# Patient Record
Sex: Female | Born: 1935 | Race: White | Hispanic: No | Marital: Married | Smoking: Never smoker
Health system: Southern US, Community
[De-identification: ages and names within clinical notes are randomized; demographics above are authoritative.]

## PROBLEM LIST (undated history)

## (undated) DIAGNOSIS — G629 Polyneuropathy, unspecified: Secondary | ICD-10-CM

## (undated) DIAGNOSIS — G510 Bell's palsy: Secondary | ICD-10-CM

## (undated) DIAGNOSIS — C439 Malignant melanoma of skin, unspecified: Secondary | ICD-10-CM

## (undated) DIAGNOSIS — N6019 Diffuse cystic mastopathy of unspecified breast: Secondary | ICD-10-CM

## (undated) DIAGNOSIS — M1990 Unspecified osteoarthritis, unspecified site: Secondary | ICD-10-CM

## (undated) DIAGNOSIS — H9190 Unspecified hearing loss, unspecified ear: Secondary | ICD-10-CM

## (undated) DIAGNOSIS — K219 Gastro-esophageal reflux disease without esophagitis: Secondary | ICD-10-CM

## (undated) DIAGNOSIS — Z923 Personal history of irradiation: Secondary | ICD-10-CM

## (undated) DIAGNOSIS — H01119 Allergic dermatitis of unspecified eye, unspecified eyelid: Secondary | ICD-10-CM

## (undated) DIAGNOSIS — L719 Rosacea, unspecified: Secondary | ICD-10-CM

## (undated) DIAGNOSIS — C50919 Malignant neoplasm of unspecified site of unspecified female breast: Secondary | ICD-10-CM

## (undated) DIAGNOSIS — E039 Hypothyroidism, unspecified: Secondary | ICD-10-CM

## (undated) DIAGNOSIS — Z9109 Other allergy status, other than to drugs and biological substances: Secondary | ICD-10-CM

## (undated) DIAGNOSIS — K589 Irritable bowel syndrome without diarrhea: Secondary | ICD-10-CM

## (undated) HISTORY — DX: Rosacea, unspecified: L71.9

## (undated) HISTORY — DX: Bell's palsy: G51.0

## (undated) HISTORY — DX: Malignant melanoma of skin, unspecified: C43.9

## (undated) HISTORY — PX: ABDOMINAL HYSTERECTOMY: SHX81

## (undated) HISTORY — DX: Irritable bowel syndrome, unspecified: K58.9

## (undated) HISTORY — DX: Allergic dermatitis of unspecified eye, unspecified eyelid: H01.119

## (undated) HISTORY — DX: Unspecified osteoarthritis, unspecified site: M19.90

## (undated) HISTORY — DX: Diffuse cystic mastopathy of unspecified breast: N60.19

## (undated) HISTORY — PX: OTHER SURGICAL HISTORY: SHX169

## (undated) HISTORY — PX: EYE SURGERY: SHX253

---

## 2003-11-11 ENCOUNTER — Other Ambulatory Visit: Admission: RE | Admit: 2003-11-11 | Discharge: 2003-11-11 | Payer: Self-pay | Admitting: Obstetrics and Gynecology

## 2004-02-03 ENCOUNTER — Encounter: Admission: RE | Admit: 2004-02-03 | Discharge: 2004-02-03 | Payer: Self-pay | Admitting: Obstetrics and Gynecology

## 2005-01-08 ENCOUNTER — Ambulatory Visit: Payer: Self-pay | Admitting: General Practice

## 2005-01-15 ENCOUNTER — Ambulatory Visit: Payer: Self-pay | Admitting: General Practice

## 2005-03-08 ENCOUNTER — Other Ambulatory Visit: Admission: RE | Admit: 2005-03-08 | Discharge: 2005-03-08 | Payer: Self-pay | Admitting: Obstetrics and Gynecology

## 2006-01-28 ENCOUNTER — Ambulatory Visit: Payer: Self-pay | Admitting: Unknown Physician Specialty

## 2006-01-29 ENCOUNTER — Ambulatory Visit: Payer: Self-pay | Admitting: Unknown Physician Specialty

## 2007-08-20 ENCOUNTER — Ambulatory Visit: Payer: Self-pay | Admitting: Internal Medicine

## 2008-08-23 ENCOUNTER — Ambulatory Visit: Payer: Self-pay | Admitting: Internal Medicine

## 2009-09-05 ENCOUNTER — Ambulatory Visit: Payer: Self-pay | Admitting: Internal Medicine

## 2009-12-10 DIAGNOSIS — C439 Malignant melanoma of skin, unspecified: Secondary | ICD-10-CM

## 2009-12-10 HISTORY — DX: Malignant melanoma of skin, unspecified: C43.9

## 2010-01-11 ENCOUNTER — Ambulatory Visit: Payer: Self-pay | Admitting: Ophthalmology

## 2010-09-06 ENCOUNTER — Ambulatory Visit: Payer: Self-pay | Admitting: Internal Medicine

## 2011-10-24 ENCOUNTER — Ambulatory Visit: Payer: Self-pay | Admitting: Internal Medicine

## 2012-10-24 ENCOUNTER — Ambulatory Visit: Payer: Self-pay | Admitting: Internal Medicine

## 2013-10-26 ENCOUNTER — Ambulatory Visit: Payer: Self-pay | Admitting: Internal Medicine

## 2013-11-10 DIAGNOSIS — G510 Bell's palsy: Secondary | ICD-10-CM | POA: Insufficient documentation

## 2013-11-10 DIAGNOSIS — M1990 Unspecified osteoarthritis, unspecified site: Secondary | ICD-10-CM | POA: Insufficient documentation

## 2013-11-10 DIAGNOSIS — L719 Rosacea, unspecified: Secondary | ICD-10-CM | POA: Insufficient documentation

## 2013-11-10 DIAGNOSIS — Z90710 Acquired absence of both cervix and uterus: Secondary | ICD-10-CM | POA: Insufficient documentation

## 2013-11-10 DIAGNOSIS — C439 Malignant melanoma of skin, unspecified: Secondary | ICD-10-CM | POA: Insufficient documentation

## 2013-12-16 DIAGNOSIS — H01119 Allergic dermatitis of unspecified eye, unspecified eyelid: Secondary | ICD-10-CM | POA: Insufficient documentation

## 2014-07-26 ENCOUNTER — Encounter: Payer: Self-pay | Admitting: Podiatry

## 2014-07-26 ENCOUNTER — Ambulatory Visit (INDEPENDENT_AMBULATORY_CARE_PROVIDER_SITE_OTHER): Payer: Medicare Other | Admitting: Podiatry

## 2014-07-26 VITALS — BP 144/70 | HR 73 | Resp 16 | Ht 62.0 in | Wt 150.0 lb

## 2014-07-26 DIAGNOSIS — L600 Ingrowing nail: Secondary | ICD-10-CM

## 2014-07-26 MED ORDER — NEOMYCIN-POLYMYXIN-HC 3.5-10000-1 OT SOLN: OTIC | Status: DC

## 2014-07-26 NOTE — Patient Instructions (Signed)

## 2014-07-26 NOTE — Progress Notes (Signed)
She presents today complaining of a painful hallux nail right. She states it feels like loading I have fluid out from underneath.  Objective: Vital signs are stable she is alert and oriented x3. Pulses are strongly palpable right. Nail plate hallux right is thick yellow dystrophic sharply incurvated and painful on palpation. Mild erythema surrounding the nail plate.  Assessment: Pain in limb secondary to onychomycosis and ingrown nail hallux right. Paronychia hallux right.  Plan: Total nail avulsion permanent in nature. This was performed after 3 cc of a 50-50 mixture of Marcaine plain lidocaine plain was infiltrated in a hallux block right. The nail plate was avulsed and 3 applications of phenol were applied dry sterile compressive dressing was applied. She "both oral and home-going instructions for the care of soaking this toe as well as a prescription for Cortisporin Otic which applied twice daily after soaking. I will followup with her in one week.

## 2014-08-02 ENCOUNTER — Encounter: Payer: Self-pay | Admitting: Podiatry

## 2014-08-02 ENCOUNTER — Ambulatory Visit (INDEPENDENT_AMBULATORY_CARE_PROVIDER_SITE_OTHER): Payer: Medicare Other | Admitting: Podiatry

## 2014-08-02 VITALS — BP 142/86 | HR 74 | Resp 12

## 2014-08-02 DIAGNOSIS — L600 Ingrowing nail: Secondary | ICD-10-CM

## 2014-08-02 NOTE — Progress Notes (Signed)
She presents today for followup of her nail avulsion hallux right.  Objective: Vital signs are stable she is alert and oriented x3. The hallux nail bed appears to be granulating in quite nicely epithelialization is occurring.  Assessment: Well-healing surgical toe hallux right.  Plan: Discontinue Betadine start with Epsom salts warm water soaks covered in the day and Levaquin night.

## 2014-10-27 ENCOUNTER — Ambulatory Visit: Payer: Self-pay | Admitting: Internal Medicine

## 2015-03-05 ENCOUNTER — Ambulatory Visit: Payer: Self-pay | Admitting: Internal Medicine

## 2015-10-18 ENCOUNTER — Other Ambulatory Visit: Payer: Self-pay | Admitting: Internal Medicine

## 2015-10-18 DIAGNOSIS — Z1231 Encounter for screening mammogram for malignant neoplasm of breast: Secondary | ICD-10-CM

## 2015-10-31 ENCOUNTER — Ambulatory Visit
Admission: RE | Admit: 2015-10-31 | Discharge: 2015-10-31 | Disposition: A | Discharge status: Home / Self Care | Payer: Medicare Other | Source: Ambulatory Visit | Attending: Internal Medicine | Admitting: Internal Medicine

## 2015-10-31 ENCOUNTER — Other Ambulatory Visit: Payer: Self-pay | Admitting: Internal Medicine

## 2015-10-31 DIAGNOSIS — Z1231 Encounter for screening mammogram for malignant neoplasm of breast: Secondary | ICD-10-CM

## 2015-12-11 DIAGNOSIS — Z923 Personal history of irradiation: Secondary | ICD-10-CM

## 2015-12-11 DIAGNOSIS — C50919 Malignant neoplasm of unspecified site of unspecified female breast: Secondary | ICD-10-CM

## 2015-12-11 HISTORY — DX: Personal history of irradiation: Z92.3

## 2015-12-11 HISTORY — DX: Malignant neoplasm of unspecified site of unspecified female breast: C50.919

## 2016-01-11 ENCOUNTER — Ambulatory Visit (INDEPENDENT_AMBULATORY_CARE_PROVIDER_SITE_OTHER): Payer: Medicare Other

## 2016-01-11 ENCOUNTER — Encounter: Payer: Self-pay | Admitting: Podiatry

## 2016-01-11 ENCOUNTER — Ambulatory Visit (INDEPENDENT_AMBULATORY_CARE_PROVIDER_SITE_OTHER): Payer: Medicare Other | Admitting: Podiatry

## 2016-01-11 VITALS — BP 149/68 | HR 70 | Resp 16

## 2016-01-11 DIAGNOSIS — N760 Acute vaginitis: Secondary | ICD-10-CM | POA: Insufficient documentation

## 2016-01-11 DIAGNOSIS — M722 Plantar fascial fibromatosis: Secondary | ICD-10-CM | POA: Diagnosis not present

## 2016-01-11 DIAGNOSIS — N390 Urinary tract infection, site not specified: Secondary | ICD-10-CM | POA: Insufficient documentation

## 2016-01-11 DIAGNOSIS — K589 Irritable bowel syndrome without diarrhea: Secondary | ICD-10-CM | POA: Insufficient documentation

## 2016-01-11 DIAGNOSIS — Z9109 Other allergy status, other than to drugs and biological substances: Secondary | ICD-10-CM | POA: Insufficient documentation

## 2016-01-11 DIAGNOSIS — N6019 Diffuse cystic mastopathy of unspecified breast: Secondary | ICD-10-CM | POA: Insufficient documentation

## 2016-01-11 NOTE — Patient Instructions (Signed)

## 2016-01-11 NOTE — Progress Notes (Signed)
She presents today with chief complaint of pain to the right heel. States this been going on for a while now.  Objective: Vital signs stable alert and 3 pulses are palpable. Radiographs and straight soft tissue increase in density of the fascial calcaneal insertion site. Pulses are palpable. On palpation medial tubercle of the right heel.  Assessment: Plantar fascitis right.  Plan: Injected the right heel today with Kenalog and local anesthetic placed her in plantar fascial brace.

## 2016-02-08 DIAGNOSIS — C50919 Malignant neoplasm of unspecified site of unspecified female breast: Secondary | ICD-10-CM | POA: Insufficient documentation

## 2016-02-10 ENCOUNTER — Other Ambulatory Visit: Payer: Self-pay | Admitting: Internal Medicine

## 2016-02-10 DIAGNOSIS — N63 Unspecified lump in unspecified breast: Secondary | ICD-10-CM

## 2016-02-21 ENCOUNTER — Ambulatory Visit
Admission: RE | Admit: 2016-02-21 | Discharge: 2016-02-21 | Disposition: A | Discharge status: Home / Self Care | Payer: Medicare Other | Source: Ambulatory Visit | Attending: Internal Medicine | Admitting: Internal Medicine

## 2016-02-21 ENCOUNTER — Other Ambulatory Visit: Payer: Self-pay | Admitting: Internal Medicine

## 2016-02-21 DIAGNOSIS — N63 Unspecified lump in unspecified breast: Secondary | ICD-10-CM

## 2016-02-21 DIAGNOSIS — N6310 Unspecified lump in the right breast, unspecified quadrant: Secondary | ICD-10-CM

## 2016-02-27 ENCOUNTER — Ambulatory Visit
Admission: RE | Admit: 2016-02-27 | Discharge: 2016-02-27 | Disposition: A | Discharge status: Home / Self Care | Payer: Medicare Other | Source: Ambulatory Visit | Attending: Internal Medicine | Admitting: Internal Medicine

## 2016-02-27 DIAGNOSIS — C50911 Malignant neoplasm of unspecified site of right female breast: Secondary | ICD-10-CM | POA: Diagnosis not present

## 2016-02-27 DIAGNOSIS — N6310 Unspecified lump in the right breast, unspecified quadrant: Secondary | ICD-10-CM

## 2016-02-27 DIAGNOSIS — N63 Unspecified lump in breast: Secondary | ICD-10-CM | POA: Diagnosis present

## 2016-02-29 HISTORY — PX: BREAST BIOPSY: SHX20

## 2016-03-01 ENCOUNTER — Inpatient Hospital Stay: Payer: Medicare Other | Attending: Oncology | Admitting: Oncology

## 2016-03-01 ENCOUNTER — Encounter: Payer: Self-pay | Admitting: Oncology

## 2016-03-01 ENCOUNTER — Encounter: Payer: Self-pay | Admitting: *Deleted

## 2016-03-01 VITALS — BP 146/69 | HR 76 | Temp 98.4°F | Resp 18 | Ht 63.39 in | Wt 156.1 lb

## 2016-03-01 DIAGNOSIS — K219 Gastro-esophageal reflux disease without esophagitis: Secondary | ICD-10-CM | POA: Insufficient documentation

## 2016-03-01 DIAGNOSIS — Z170 Estrogen receptor positive status [ER+]: Secondary | ICD-10-CM | POA: Insufficient documentation

## 2016-03-01 DIAGNOSIS — F419 Anxiety disorder, unspecified: Secondary | ICD-10-CM | POA: Diagnosis not present

## 2016-03-01 DIAGNOSIS — Z85820 Personal history of malignant melanoma of skin: Secondary | ICD-10-CM | POA: Insufficient documentation

## 2016-03-01 DIAGNOSIS — K589 Irritable bowel syndrome without diarrhea: Secondary | ICD-10-CM | POA: Insufficient documentation

## 2016-03-01 DIAGNOSIS — N6019 Diffuse cystic mastopathy of unspecified breast: Secondary | ICD-10-CM | POA: Diagnosis not present

## 2016-03-01 DIAGNOSIS — C50911 Malignant neoplasm of unspecified site of right female breast: Secondary | ICD-10-CM

## 2016-03-01 DIAGNOSIS — Z7982 Long term (current) use of aspirin: Secondary | ICD-10-CM | POA: Diagnosis not present

## 2016-03-01 DIAGNOSIS — C50511 Malignant neoplasm of lower-outer quadrant of right female breast: Secondary | ICD-10-CM | POA: Diagnosis not present

## 2016-03-01 DIAGNOSIS — Z79899 Other long term (current) drug therapy: Secondary | ICD-10-CM | POA: Insufficient documentation

## 2016-03-01 LAB — SURGICAL PATHOLOGY

## 2016-03-01 NOTE — Progress Notes (Signed)
  Oncology Nurse Navigator Documentation  Navigator Location: [facility]-Med Onc (03/01/16 1100) Navigator Encounter Type: Initial MedOnc (03/01/16 1100)   Abnormal Finding Date: 02/21/16 (03/01/16 1100) Confirmed Diagnosis Date: 02/28/16 (03/01/16 1100)     Patient Visit Type: MedOnc (03/01/16 1100)   Barriers/Navigation Needs: Education;Coordination of Care (03/01/16 1100) Education: Understanding Cancer/ Treatment Options;Coping with Diagnosis/ Prognosis;Newly Diagnosed Cancer Education;Preparing for Upcoming Surgery/ Treatment (03/01/16 1100) Interventions: Coordination of Care (03/01/16 1100)            Acuity: Level 2 (03/01/16 1100)         Time Spent with Patient: 120 (03/01/16 1100)   Met with patient and her husband today during her initial medical oncology consult with Dr. Grayland Ormond.  ER/PR and Her2neu not available today.  Gave patient breast cancer educational literature, "My Breast Cancer Treatment Handbook" by Josephine Igo, RN.  Scheduled patient to see Dr. Rochel Brome tomorrow at 8:00 for surgical consultation.  Patient is to call me back with her surgery date, and I can schedule a follow up with Dr. Grayland Ormond at that time.  Reviewed plan of care.  Patient is to call if she has any questions or needs.

## 2016-03-01 NOTE — Progress Notes (Signed)
Patient noticed a discoloration of right nipple for 3 months before having a work up from PCP.

## 2016-03-02 ENCOUNTER — Other Ambulatory Visit: Payer: Self-pay | Admitting: Surgery

## 2016-03-02 DIAGNOSIS — C50011 Malignant neoplasm of nipple and areola, right female breast: Secondary | ICD-10-CM

## 2016-03-08 ENCOUNTER — Encounter
Admission: RE | Admit: 2016-03-08 | Discharge: 2016-03-08 | Disposition: A | Discharge status: Home / Self Care | Payer: Medicare Other | Source: Ambulatory Visit | Attending: Surgery | Admitting: Surgery

## 2016-03-08 DIAGNOSIS — Z01812 Encounter for preprocedural laboratory examination: Secondary | ICD-10-CM | POA: Diagnosis present

## 2016-03-08 DIAGNOSIS — Z01810 Encounter for preprocedural cardiovascular examination: Secondary | ICD-10-CM | POA: Insufficient documentation

## 2016-03-08 HISTORY — DX: Other allergy status, other than to drugs and biological substances: Z91.09

## 2016-03-08 HISTORY — DX: Gastro-esophageal reflux disease without esophagitis: K21.9

## 2016-03-08 LAB — COMPREHENSIVE METABOLIC PANEL
ALBUMIN: 3.8 g/dL (ref 3.5–5.0)
ALK PHOS: 74 U/L (ref 38–126)
ALT: 41 U/L (ref 14–54)
AST: 59 U/L — ABNORMAL HIGH (ref 15–41)
Anion gap: 8 (ref 5–15)
BUN: 21 mg/dL — ABNORMAL HIGH (ref 6–20)
CALCIUM: 9.3 mg/dL (ref 8.9–10.3)
CO2: 26 mmol/L (ref 22–32)
CREATININE: 0.94 mg/dL (ref 0.44–1.00)
Chloride: 105 mmol/L (ref 101–111)
GFR calc Af Amer: >60 mL/min (ref >60)
GFR calc non Af Amer: 55 mL/min — ABNORMAL LOW (ref >60)
GLUCOSE: 104 mg/dL — AB (ref 65–99)
Potassium: 3.9 mmol/L (ref 3.5–5.1)
SODIUM: 139 mmol/L (ref 135–145)
Total Bilirubin: 0.5 mg/dL (ref 0.3–1.2)
Total Protein: 7 g/dL (ref 6.5–8.1)

## 2016-03-08 LAB — CBC WITH DIFFERENTIAL/PLATELET
BASOS ABS: 0 10*3/uL (ref 0–0.1)
BASOS PCT: 1 %
EOS ABS: 0.2 10*3/uL (ref 0–0.7)
EOS PCT: 2 %
HCT: 38.9 % (ref 35.0–47.0)
Hemoglobin: 13.5 g/dL (ref 12.0–16.0)
LYMPHS PCT: 21 %
Lymphs Abs: 1.6 10*3/uL (ref 1.0–3.6)
MCH: 32.1 pg (ref 26.0–34.0)
MCHC: 34.6 g/dL (ref 32.0–36.0)
MCV: 92.7 fL (ref 80.0–100.0)
Monocytes Absolute: 0.5 10*3/uL (ref 0.2–0.9)
Monocytes Relative: 6 %
Neutro Abs: 5.3 10*3/uL (ref 1.4–6.5)
Neutrophils Relative %: 70 %
Platelets: 232 10*3/uL (ref 150–440)
RBC: 4.19 MIL/uL (ref 3.80–5.20)
RDW: 13.4 % (ref 11.5–14.5)
WBC: 7.5 10*3/uL (ref 3.6–11.0)

## 2016-03-08 NOTE — Patient Instructions (Signed)
  Your procedure is scheduled on: 03/16/16 Fri Report to Day Surgery.2nd floor medical mall To find out your arrival time please call [phone] between 1PM - 3PM on 03/15/16 Thurs.  Remember: Instructions that are not followed completely may result in serious medical risk, up to and including death, or upon the discretion of your surgeon and anesthesiologist your surgery may need to be rescheduled.    _x___ 1. Do not eat food or drink liquids after midnight. No gum chewing or hard candies.     ____ 2. No Alcohol for 24 hours before or after surgery.   ____ 3. Bring all medications with you on the day of surgery if instructed.    __x__ 4. Notify your doctor if there is any change in your medical condition     (cold, fever, infections).     Do not wear jewelry, make-up, hairpins, clips or nail polish.  Do not wear lotions, powders, or perfumes. You may wear deodorant.  Do not shave 48 hours prior to surgery. Men may shave face and neck.  Do not bring valuables to the hospital.    [facility] is not responsible for any belongings or valuables.               Contacts, dentures or bridgework may not be worn into surgery.  Leave your suitcase in the car. After surgery it may be brought to your room.  For patients admitted to the hospital, discharge time is determined by your                treatment team.   Patients discharged the day of surgery will not be allowed to drive home.   Please read over the following fact sheets that you were given:      __x__ Take these medicines the morning of surgery with A SIP OF WATER:    1. RABEprazole (ACIPHEX) 20 MG tablet  2.   3.   4.  5.  6.  ____ Fleet Enema (as directed)   _x___ Use CHG Soap as directed  ____ Use inhalers on the day of surgery  ____ Stop metformin 2 days prior to surgery    ____ Take 1/2 of usual insulin dose the night before surgery and none on the morning of surgery.   __x__ Stop Coumadin/Plavix/aspirin on stop  aspirin 1 week before surgery  _x___ Stop Anti-inflammatories on stop naproxen 1 week before surgery, may take Tylenol as needed   ____ Stop supplements until after surgery.    ____ Bring C-Pap to the hospital.

## 2016-03-10 HISTORY — PX: BREAST EXCISIONAL BIOPSY: SUR124

## 2016-03-14 DIAGNOSIS — C50111 Malignant neoplasm of central portion of right female breast: Secondary | ICD-10-CM | POA: Insufficient documentation

## 2016-03-14 NOTE — Progress Notes (Signed)
[facility]  [phone] Fax:(336) [phone]  ID: [id number] FRARY OB: May 05, 1933  MR#: [id number]  ZDG#:[id number]  Patient Care Team: Idelle Crouch, MD as PCP - General (Internal Medicine)  CHIEF COMPLAINT:  Chief Complaint  Patient presents with  . New Evaluation    breast cancer    INTERVAL HISTORY: Patient is an 82 year old female who had a normal screening mammogram in November 2016. She noted some discoloration of her breast which prompted another mammogram revealing a suspicious mass. Subsequent biopsy confirmed adenocarcinoma of her right breast. Currently, she is anxious but otherwise feels well. She has no neurologic complaint. She denies any fevers. She has a good appetite and denies weight loss. She denies any pain. She has no chest pain or shortness of breath. She denies any nausea, vomiting, constipation, or diarrhea. She has no urinary complaints. Patient feels at her baseline and offers no further specific complaints.  REVIEW OF SYSTEMS:   Review of Systems  Constitutional: Negative.  Negative for fever, weight loss and malaise/fatigue.  Respiratory: Negative.  Negative for sputum production.   Cardiovascular: Negative.  Negative for chest pain.  Gastrointestinal: Negative.   Genitourinary: Negative.   Neurological: Negative.  Negative for weakness.  Psychiatric/Behavioral: The patient is nervous/anxious.     As per HPI. Otherwise, a complete review of systems is negatve.  PAST MEDICAL HISTORY: Past Medical History  Diagnosis Date  . Dermatitis contact, eyelid   . Rosacea   . Bell's palsy   . Melanoma of skin ([place]) 2011    left upper arm  . Osteoarthritis   . Fibrocystic breast disease     being followed at [facility]   . IBS (irritable bowel syndrome)   . Environmental allergies   . GERD (gastroesophageal reflux disease)     PAST SURGICAL HISTORY: Past Surgical History  Procedure Laterality Date  . Eyelid lift    . Repair  tear ducts    . Endoscopic carpal tunnel release Right   . Abdominal hysterectomy      FAMILY HISTORY Family History  Problem Relation Age of Onset  . Breast cancer Neg Hx   . Skin cancer Brother     non-melanoma  . Skin cancer Father     non-melanoma  . Throat cancer Father   . Stroke Mother        ADVANCED DIRECTIVES:    HEALTH MAINTENANCE: Social History  Substance Use Topics  . Smoking status: Never Smoker   . Smokeless tobacco: Not on file  . Alcohol Use: 0.0 oz/week    0 Standard drinks or equivalent per week     Colonoscopy:  PAP:  Bone density:  Lipid panel:  Allergies  Allergen Reactions  . Benzalkonium Chloride Other (See Comments)    Positive patch test  . Garlic Diarrhea  . Methylisothiazolinone Other (See Comments)    Positive patch test Positive patch test    Current Outpatient Prescriptions  Medication Sig Dispense Refill  . aspirin EC 81 MG tablet Take by mouth.    . Cholecalciferol (VITAMIN D3) 1000 units CAPS Take by mouth daily.    . fexofenadine (ALLEGRA) 180 MG tablet Take by mouth.    . fluticasone (FLONASE) 50 MCG/ACT nasal spray Place 1 spray into the nose daily.     Marland Kitchen gentamicin ointment (GARAMYCIN) 0.1 %     . metroNIDAZOLE (METROGEL) 0.75 % gel Apply 1 application topically 2 (two) times daily.     . naproxen sodium (ANAPROX) 220 MG  tablet Take 220 mg by mouth 2 (two) times daily with a meal.    . RABEprazole (ACIPHEX) 20 MG tablet Take 20 mg by mouth 2 (two) times daily.     Marland Kitchen acetaminophen (TYLENOL) 500 MG tablet Take 500 mg by mouth every 6 (six) hours as needed.    . Biotin 5000 MCG TABS Take 1 tablet by mouth daily.    Marland Kitchen Optical Supplies (OCUSOFT DRY EYE) KIT by Does not apply route.    Marland Kitchen Propylene Glycol (SYSTANE BALANCE OP) Apply 1 drop to eye 2 (two) times daily.     No current facility-administered medications for this visit.    OBJECTIVE: Filed Vitals:   03/01/16 1037  BP: 146/69  Pulse: 76  Temp: 98.4 F (36.9  C)  Resp: 18     Body mass index is 27.31 kg/(m^2).    ECOG FS:0 - Asymptomatic  General: Well-developed, well-nourished, no acute distress. Eyes: Pink conjunctiva, anicteric sclera. HEENT: Normocephalic, moist mucous membranes, clear oropharnyx. Breasts: Patient requested exam be deferred today. Lungs: Clear to auscultation bilaterally. Heart: Regular rate and rhythm. No rubs, murmurs, or gallops. Abdomen: Soft, nontender, nondistended. No organomegaly noted, normoactive bowel sounds. Musculoskeletal: No edema, cyanosis, or clubbing. Neuro: Alert, answering all questions appropriately. Cranial nerves grossly intact. Skin: No rashes or petechiae noted. Psych: Normal affect. Lymphatics: No cervical, calvicular, axillary or inguinal LAD.   LAB RESULTS:  Lab Results  Component Value Date   NA 139 03/08/2016   K 3.9 03/08/2016   CL 105 03/08/2016   CO2 26 03/08/2016   GLUCOSE 104* 03/08/2016   BUN 21* 03/08/2016   CREATININE 0.94 03/08/2016   CALCIUM 9.3 03/08/2016   PROT 7.0 03/08/2016   ALBUMIN 3.8 03/08/2016   AST 59* 03/08/2016   ALT 41 03/08/2016   ALKPHOS 74 03/08/2016   BILITOT 0.5 03/08/2016   GFRNONAA 55* 03/08/2016   GFRAA >60 03/08/2016    Lab Results  Component Value Date   WBC 7.5 03/08/2016   NEUTROABS 5.3 03/08/2016   HGB 13.5 03/08/2016   HCT 38.9 03/08/2016   MCV 92.7 03/08/2016   PLT 232 03/08/2016     STUDIES: Mm Digital Diagnostic Unilat R  02/27/2016  CLINICAL DATA:  Status post ultrasound-guided right breast biopsy EXAM: DIAGNOSTIC RIGHT MAMMOGRAM POST ULTRASOUND BIOPSY COMPARISON:  Previous exam(s). FINDINGS: Mammographic images were obtained following ultrasound guided biopsy of an indeterminate right breast mass at 7 o'clock, 1 cm from the nipple. Post biopsy mammogram demonstrates the ribbon shaped biopsy marker in the expected location within the slightly lower, outer right breast. IMPRESSION: Satisfactory marker placement as above. Final  Assessment: Post Procedure Mammograms for Marker Placement Electronically Signed   By: Pamelia Hoit M.D.   On: 02/27/2016 14:49   US Breast Ltd Uni Right Inc Axilla  02/21/2016  CLINICAL DATA:  82 year old patient palpates a right breast retroareolar lump. She has also noticed that a portion of the lower outer quadrant of the right nipple has a dark colored protrusion/lump. She denies any nipple discharge. EXAM: DIGITAL DIAGNOSTIC RIGHT MAMMOGRAM WITH 3D TOMOSYNTHESIS WITH CAD ULTRASOUND RIGHT BREAST COMPARISON:  Previous exam(s). ACR Breast Density Category b: There are scattered areas of fibroglandular density. FINDINGS: A metallic skin marker was placed at the site of the palpable lump, just inferior to the right nipple. Spot tangential view of this region shows a superficial circumscribed oval 11 mm mass. The anterior margin of the mass appears contiguous with versus immediately subjacent to the right nipple.  No additional masses are identified in the right breast. There is no suspicious microcalcification or distortion. Mammographic images were processed with CAD. On physical exam, there is an approximately 4 mm raised, dark purplish area associated with the 7 o'clock region of the right nipple. The overlying skin is intact. I attempted to elicit nipple discharge and there is none. Deep to the nipple is a palpable retroareolar mass measuring approximately 1 cm. Targeted ultrasound is performed, showing a hypoechoic solid oval mass with a few internal cystic spaces is imaged immediately deep to the right nipple measuring 1.1 x 0.9 x 0.9 cm. There is pulsatile vascular flow within portions of the mass. The superficial margin of the mass is contiguous with, and may involve the skin of the right nipple. Ultrasound of the right axilla is negative. IMPRESSION: 1.1 cm solid vascular mass retroareolar right breast, contiguous with versus immediately subjacent to the skin of the right nipple. The patient has a purple  discoloration of the tip of the nipple in the 7 o'clock region on physical exam. Findings are most suggestive of papilloma. Malignancy cannot be excluded. It is possible that the discoloration of the dermis of the right nipple could be secondary to blood products if the vascular mass has bled. RECOMMENDATION: Ultrasound-guided core needle biopsy is recommended. Our office will contact the ordering physician's office to obtain an order, and will arrange a date and time for biopsy with the patient. I have discussed the findings and recommendations with the patient. Results were also provided in writing at the conclusion of the visit. If applicable, a reminder letter will be sent to the patient regarding the next appointment. BI-RADS CATEGORY  4: Suspicious. Electronically Signed   By: Curlene Dolphin M.D.   On: 02/21/2016 09:11   Mm Diag Breast Tomo Uni Right  02/21/2016  CLINICAL DATA:  82 year old patient palpates a right breast retroareolar lump. She has also noticed that a portion of the lower outer quadrant of the right nipple has a dark colored protrusion/lump. She denies any nipple discharge. EXAM: DIGITAL DIAGNOSTIC RIGHT MAMMOGRAM WITH 3D TOMOSYNTHESIS WITH CAD ULTRASOUND RIGHT BREAST COMPARISON:  Previous exam(s). ACR Breast Density Category b: There are scattered areas of fibroglandular density. FINDINGS: A metallic skin marker was placed at the site of the palpable lump, just inferior to the right nipple. Spot tangential view of this region shows a superficial circumscribed oval 11 mm mass. The anterior margin of the mass appears contiguous with versus immediately subjacent to the right nipple. No additional masses are identified in the right breast. There is no suspicious microcalcification or distortion. Mammographic images were processed with CAD. On physical exam, there is an approximately 4 mm raised, dark purplish area associated with the 7 o'clock region of the right nipple. The overlying skin is  intact. I attempted to elicit nipple discharge and there is none. Deep to the nipple is a palpable retroareolar mass measuring approximately 1 cm. Targeted ultrasound is performed, showing a hypoechoic solid oval mass with a few internal cystic spaces is imaged immediately deep to the right nipple measuring 1.1 x 0.9 x 0.9 cm. There is pulsatile vascular flow within portions of the mass. The superficial margin of the mass is contiguous with, and may involve the skin of the right nipple. Ultrasound of the right axilla is negative. IMPRESSION: 1.1 cm solid vascular mass retroareolar right breast, contiguous with versus immediately subjacent to the skin of the right nipple. The patient has a purple discoloration of the tip  of the nipple in the 7 o'clock region on physical exam. Findings are most suggestive of papilloma. Malignancy cannot be excluded. It is possible that the discoloration of the dermis of the right nipple could be secondary to blood products if the vascular mass has bled. RECOMMENDATION: Ultrasound-guided core needle biopsy is recommended. Our office will contact the ordering physician's office to obtain an order, and will arrange a date and time for biopsy with the patient. I have discussed the findings and recommendations with the patient. Results were also provided in writing at the conclusion of the visit. If applicable, a reminder letter will be sent to the patient regarding the next appointment. BI-RADS CATEGORY  4: Suspicious. Electronically Signed   By: Curlene Dolphin M.D.   On: 02/21/2016 09:11   [place] Rt Breast Bx W Loc Dev 1st Lesion Img Bx Spec US Guide  02/29/2016  ADDENDUM REPORT: 02/29/2016 16:30 ADDENDUM: Pathology of the right breast biopsy revealed a MUCINOUS MAMMARY ADENOCARCINOMA. PRELIMINARY GRADE: 2 ([place] GRADE) Comment: The definitive grade will be assigned on the excisional specimen. These findings were communicated to Dr. Doy Hutching on 02/28/2016 by the pathologist. This was  found to be concordant with Dr. Raul Del impression and notes. Recommendations:  Surgical and oncology referral. The patient was contacted by Jetta Lout, [place] on 02/29/16. She stated she has done well following the biopsy with no bleeding, bruising, or hematoma. Post biopsy instructions were reviewed with the patient. All of her questions were answered. She was encouraged to call the [place] [facility] with any further questions or concerns. She stated she had been contacted by her physician with the results. She has an appointment with Dr. Grayland Ormond in oncology on 03/01/16 at 9:45 AM. Addendum by Jetta Lout, [place] on 02/29/16. Electronically Signed   By: Pamelia Hoit M.D.   On: 02/29/2016 16:30  02/29/2016  CLINICAL DATA:  82 year old female for biopsy of an indeterminate right breast mass EXAM: ULTRASOUND GUIDED RIGHT BREAST CORE NEEDLE BIOPSY COMPARISON:  Previous exam(s). PROCEDURE: I met with the patient and we discussed the procedure of ultrasound-guided biopsy, including benefits and alternatives. We discussed the high likelihood of a successful procedure. We discussed the risks of the procedure including infection, bleeding, tissue injury, clip migration, and inadequate sampling. Informed written consent was given. The usual time-out protocol was performed immediately prior to the procedure. Using sterile technique and 1% Lidocaine as local anesthetic, under direct ultrasound visualization, a 12 gauge vacuum-assisted device was used to perform biopsy of an indeterminate right breast mass at 7 o'clock, 1 cm from the nipple using a lateral to medial approach. At the conclusion of the procedure, a ribbon shaped tissue marker clip was deployed into the biopsy cavity. Follow-up 2-view mammogram was performed and dictated separately. IMPRESSION: Ultrasound-guided biopsy of an indeterminate right breast mass at 7 o'clock. No apparent complications. Electronically Signed: By: Pamelia Hoit M.D. 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  PLAN:    1. Breast cancer: Although patient is 82 years old, she may benefit from adjuvant chemotherapy given the speed at which this tumor arose. She had a normal mammogram in November 2016. While awaiting final pathology results, will order mammoprint on her biopsy. Referral has been made to surgery for consideration of lumpectomy and/or mastectomy.  Patient may or may not benefit from XRT depending on whether she has a lumpectomy or mastectomy. She will benefit from  an aromatase inhibitor for 5 years given the ER/PR status of her tumor. Patient will follow-up in approximately 2-3 weeks after her surgery to discuss her final pathology results and further treatment planning.  Approximately 45 minutes was spent in discussion of which greater than 50% was consultation.  Patient expressed understanding and was in agreement with this plan. She also understands that She can call clinic at any time with any questions, concerns, or complaints.    Lloyd Huger, MD   03/14/2016 8:42 AM

## 2016-03-16 ENCOUNTER — Ambulatory Visit
Admission: RE | Admit: 2016-03-16 | Discharge: 2016-03-16 | Disposition: A | Discharge status: Home / Self Care | Payer: Medicare Other | Source: Ambulatory Visit | Attending: Surgery | Admitting: Surgery

## 2016-03-16 ENCOUNTER — Ambulatory Visit: Payer: Medicare Other | Admitting: Certified Registered"

## 2016-03-16 ENCOUNTER — Encounter: Payer: Self-pay | Admitting: *Deleted

## 2016-03-16 ENCOUNTER — Encounter
Admission: RE | Disposition: A | Discharge status: Home / Self Care | Payer: Self-pay | Source: Ambulatory Visit | Attending: Surgery

## 2016-03-16 ENCOUNTER — Encounter: Payer: Self-pay | Admitting: Oncology

## 2016-03-16 DIAGNOSIS — K589 Irritable bowel syndrome without diarrhea: Secondary | ICD-10-CM | POA: Diagnosis not present

## 2016-03-16 DIAGNOSIS — Z800 Family history of malignant neoplasm of digestive organs: Secondary | ICD-10-CM | POA: Insufficient documentation

## 2016-03-16 DIAGNOSIS — C50511 Malignant neoplasm of lower-outer quadrant of right female breast: Secondary | ICD-10-CM | POA: Insufficient documentation

## 2016-03-16 DIAGNOSIS — Z9889 Other specified postprocedural states: Secondary | ICD-10-CM | POA: Diagnosis not present

## 2016-03-16 DIAGNOSIS — Z7951 Long term (current) use of inhaled steroids: Secondary | ICD-10-CM | POA: Diagnosis not present

## 2016-03-16 DIAGNOSIS — Z79899 Other long term (current) drug therapy: Secondary | ICD-10-CM | POA: Insufficient documentation

## 2016-03-16 DIAGNOSIS — Z8249 Family history of ischemic heart disease and other diseases of the circulatory system: Secondary | ICD-10-CM | POA: Insufficient documentation

## 2016-03-16 DIAGNOSIS — Z90710 Acquired absence of both cervix and uterus: Secondary | ICD-10-CM | POA: Insufficient documentation

## 2016-03-16 DIAGNOSIS — Z808 Family history of malignant neoplasm of other organs or systems: Secondary | ICD-10-CM | POA: Diagnosis not present

## 2016-03-16 DIAGNOSIS — M1990 Unspecified osteoarthritis, unspecified site: Secondary | ICD-10-CM | POA: Diagnosis not present

## 2016-03-16 DIAGNOSIS — Z91018 Allergy to other foods: Secondary | ICD-10-CM | POA: Diagnosis not present

## 2016-03-16 DIAGNOSIS — Z823 Family history of stroke: Secondary | ICD-10-CM | POA: Insufficient documentation

## 2016-03-16 DIAGNOSIS — Z87440 Personal history of urinary (tract) infections: Secondary | ICD-10-CM | POA: Diagnosis not present

## 2016-03-16 DIAGNOSIS — Z91048 Other nonmedicinal substance allergy status: Secondary | ICD-10-CM | POA: Diagnosis not present

## 2016-03-16 DIAGNOSIS — Z7982 Long term (current) use of aspirin: Secondary | ICD-10-CM | POA: Diagnosis not present

## 2016-03-16 DIAGNOSIS — Z85820 Personal history of malignant melanoma of skin: Secondary | ICD-10-CM | POA: Insufficient documentation

## 2016-03-16 DIAGNOSIS — C50011 Malignant neoplasm of nipple and areola, right female breast: Secondary | ICD-10-CM

## 2016-03-16 DIAGNOSIS — C50911 Malignant neoplasm of unspecified site of right female breast: Secondary | ICD-10-CM | POA: Diagnosis present

## 2016-03-16 HISTORY — PX: BREAST LUMPECTOMY: SHX2

## 2016-03-16 HISTORY — PX: PARTIAL MASTECTOMY WITH NEEDLE LOCALIZATION: SHX6008

## 2016-03-16 HISTORY — PX: SENTINEL NODE BIOPSY: SHX6608

## 2016-03-16 SURGERY — PARTIAL MASTECTOMY WITH NEEDLE LOCALIZATION
Anesthesia: General | Site: Breast | Laterality: Right | Wound class: Clean

## 2016-03-16 MED ORDER — ACETAMINOPHEN 10 MG/ML IV SOLN
INTRAVENOUS | Status: DC | PRN
  Administered 2016-03-16: 1000 mg via INTRAVENOUS

## 2016-03-16 MED ORDER — PROPOFOL 10 MG/ML IV BOLUS
INTRAVENOUS | Status: DC | PRN
  Administered 2016-03-16: 110 mg via INTRAVENOUS

## 2016-03-16 MED ORDER — LACTATED RINGERS IV SOLN
INTRAVENOUS | Status: DC | PRN
  Administered 2016-03-16 (×2): via INTRAVENOUS

## 2016-03-16 MED ORDER — PHENYLEPHRINE HCL 10 MG/ML IJ SOLN
10000.0000 ug | INTRAMUSCULAR | Status: DC | PRN
  Administered 2016-03-16: 10 ug/min via INTRAVENOUS

## 2016-03-16 MED ORDER — ACETAMINOPHEN 10 MG/ML IV SOLN
INTRAVENOUS | Status: AC
  Filled 2016-03-16: qty 100

## 2016-03-16 MED ORDER — PHENYLEPHRINE HCL 10 MG/ML IJ SOLN
INTRAMUSCULAR | Status: DC | PRN
  Administered 2016-03-16: 50 ug via INTRAVENOUS
  Administered 2016-03-16: 100 ug via INTRAVENOUS
  Administered 2016-03-16: 50 ug via INTRAVENOUS
  Administered 2016-03-16: 100 ug via INTRAVENOUS
  Administered 2016-03-16 (×3): 50 ug via INTRAVENOUS
  Administered 2016-03-16: 100 ug via INTRAVENOUS

## 2016-03-16 MED ORDER — BUPIVACAINE-EPINEPHRINE 0.5% -1:200000 IJ SOLN
INTRAMUSCULAR | Status: DC | PRN
  Administered 2016-03-16: 16 mL

## 2016-03-16 MED ORDER — TECHNETIUM TC 99M SULFUR COLLOID
0.9120 | Freq: Once | INTRAVENOUS | Status: AC | PRN
  Administered 2016-03-16: 0.912 via INTRAVENOUS

## 2016-03-16 MED ORDER — WHITE PETROLATUM GEL
Status: AC
  Filled 2016-03-16: qty 5

## 2016-03-16 MED ORDER — LIDOCAINE HCL (CARDIAC) 20 MG/ML IV SOLN
INTRAVENOUS | Status: DC | PRN
  Administered 2016-03-16: 80 mg via INTRAVENOUS

## 2016-03-16 MED ORDER — SUCCINYLCHOLINE CHLORIDE 20 MG/ML IJ SOLN
INTRAMUSCULAR | Status: DC | PRN
  Administered 2016-03-16: 100 mg via INTRAVENOUS

## 2016-03-16 MED ORDER — FENTANYL CITRATE (PF) 100 MCG/2ML IJ SOLN
INTRAMUSCULAR | Status: DC | PRN
  Administered 2016-03-16: 100 ug via INTRAVENOUS
  Administered 2016-03-16 (×2): 50 ug via INTRAVENOUS

## 2016-03-16 MED ORDER — ROCURONIUM BROMIDE 100 MG/10ML IV SOLN
INTRAVENOUS | Status: DC | PRN
  Administered 2016-03-16: 5 mg via INTRAVENOUS

## 2016-03-16 MED ORDER — HYDROCODONE-ACETAMINOPHEN 5-325 MG PO TABS
1.0000 | ORAL_TABLET | ORAL | Status: DC | PRN
  Administered 2016-03-16 (×2): 1 via ORAL

## 2016-03-16 MED ORDER — ONDANSETRON HCL 4 MG/2ML IJ SOLN: 4.0000 mg | Freq: Once | INTRAMUSCULAR | Status: DC | PRN

## 2016-03-16 MED ORDER — HYDROCODONE-ACETAMINOPHEN 5-325 MG PO TABS
ORAL_TABLET | ORAL | Status: AC
  Filled 2016-03-16: qty 1

## 2016-03-16 MED ORDER — ONDANSETRON HCL 4 MG/2ML IJ SOLN
INTRAMUSCULAR | Status: DC | PRN
  Administered 2016-03-16: 4 mg via INTRAVENOUS

## 2016-03-16 MED ORDER — BUPIVACAINE-EPINEPHRINE (PF) 0.5% -1:200000 IJ SOLN
INTRAMUSCULAR | Status: AC
  Filled 2016-03-16: qty 30

## 2016-03-16 MED ORDER — FENTANYL CITRATE (PF) 100 MCG/2ML IJ SOLN: 25.0000 ug | INTRAMUSCULAR | Status: DC | PRN

## 2016-03-16 MED ORDER — DEXAMETHASONE SODIUM PHOSPHATE 10 MG/ML IJ SOLN
INTRAMUSCULAR | Status: DC | PRN
  Administered 2016-03-16: 4 mg via INTRAVENOUS

## 2016-03-16 MED ORDER — HYDROCODONE-ACETAMINOPHEN 5-325 MG PO TABS: 1.0000 | ORAL_TABLET | ORAL | Status: DC | PRN

## 2016-03-16 SURGICAL SUPPLY — 32 items
BLADE SURG 15 STRL LF DISP TIS (BLADE) ×2 IMPLANT
BLADE SURG 15 STRL SS (BLADE) ×2
CANISTER SUCT 1200ML W/VALVE (MISCELLANEOUS) ×4 IMPLANT
CHLORAPREP W/TINT 26ML (MISCELLANEOUS) ×4 IMPLANT
CNTNR SPEC 2.5X3XGRAD LEK (MISCELLANEOUS) ×4
CONT SPEC 4OZ STER OR WHT (MISCELLANEOUS) ×4
CONTAINER SPEC 2.5X3XGRAD LEK (MISCELLANEOUS) ×4 IMPLANT
DEVICE DUBIN SPECIMEN MAMMOGRA (MISCELLANEOUS) ×4 IMPLANT
DRAPE LAPAROTOMY 77X122 PED (DRAPES) ×4 IMPLANT
DRESSING TELFA 4X3 1S ST N-ADH (GAUZE/BANDAGES/DRESSINGS) ×4 IMPLANT
ELECT REM PT RETURN 9FT ADLT (ELECTROSURGICAL) ×4
ELECTRODE REM PT RTRN 9FT ADLT (ELECTROSURGICAL) ×2 IMPLANT
GLOVE BIO SURGEON STRL SZ7.5 (GLOVE) ×4 IMPLANT
GOWN STRL REUS W/ TWL LRG LVL3 (GOWN DISPOSABLE) ×4 IMPLANT
GOWN STRL REUS W/TWL LRG LVL3 (GOWN DISPOSABLE) ×4
KIT RM TURNOVER STRD PROC AR (KITS) ×4 IMPLANT
LABEL OR SOLS (LABEL) ×4 IMPLANT
LIQUID BAND (GAUZE/BANDAGES/DRESSINGS) ×4 IMPLANT
MARGIN MAP 10MM (MISCELLANEOUS) ×4 IMPLANT
NDL SAFETY 18GX1.5 (NEEDLE) ×4 IMPLANT
NDL SAFETY 22GX1.5 (NEEDLE) ×4 IMPLANT
NEEDLE HYPO 25X1 1.5 SAFETY (NEEDLE) ×4 IMPLANT
PACK BASIN MINOR ARMC (MISCELLANEOUS) ×4 IMPLANT
SLEVE PROBE SENORX GAMMA FIND (MISCELLANEOUS) ×4 IMPLANT
SUT CHROMIC 4 0 RB 1X27 (SUTURE) ×8 IMPLANT
SUT ETHILON 3-0 FS-10 30 BLK (SUTURE) ×8
SUT MNCRL 4-0 (SUTURE) ×4
SUT MNCRL 4-0 27XMFL (SUTURE) ×4
SUTURE EHLN 3-0 FS-10 30 BLK (SUTURE) ×4 IMPLANT
SUTURE MNCRL 4-0 27XMF (SUTURE) ×4 IMPLANT
SYRINGE 10CC LL (SYRINGE) ×4 IMPLANT
WATER STERILE IRR 1000ML POUR (IV SOLUTION) ×4 IMPLANT

## 2016-03-16 NOTE — Anesthesia Preprocedure Evaluation (Addendum)
Anesthesia Evaluation  Patient identified by MRN, date of birth, ID band Patient awake    Reviewed: Allergy & Precautions, NPO status , Patient's Chart, lab work & pertinent test results  Airway Mallampati: III  TM Distance: <3 FB Neck ROM: Full    Dental  (+) Chipped, Caps   Pulmonary    Pulmonary exam normal breath sounds clear to auscultation       Cardiovascular negative cardio ROS Normal cardiovascular exam     Neuro/Psych Hx of Bell's palsy  Neuromuscular disease negative psych ROS   GI/Hepatic Neg liver ROS, GERD  Medicated and Controlled,IBS   Endo/Other  negative endocrine ROS  Renal/GU negative Renal ROS  negative genitourinary   Musculoskeletal  (+) Arthritis , Osteoarthritis,    Abdominal Normal abdominal exam  (+)   Peds negative pediatric ROS (+)  Hematology negative hematology ROS (+)   Anesthesia Other Findings Hx of melanoma excision of Left arm Fibrocystic breast disease Breast CA on right  Reproductive/Obstetrics                            Anesthesia Physical Anesthesia Plan  ASA: II  Anesthesia Plan: General   Post-op Pain Management:    Induction: Intravenous  Airway Management Planned: Oral ETT  Additional Equipment:   Intra-op Plan:   Post-operative Plan: Extubation in OR  Informed Consent: I have reviewed the patients History and Physical, chart, labs and discussed the procedure including the risks, benefits and alternatives for the proposed anesthesia with the patient or authorized representative who has indicated his/her understanding and acceptance.   Dental advisory given  Plan Discussed with: CRNA and Surgeon  Anesthesia Plan Comments:         Anesthesia Quick Evaluation

## 2016-03-16 NOTE — Progress Notes (Signed)
Pain level 1 at discharge

## 2016-03-16 NOTE — Progress Notes (Signed)
Dr. Smith into see 

## 2016-03-16 NOTE — Op Note (Signed)
OPERATIVE REPORT  PREOPERATIVE  DIAGNOSIS: . Right breast cancer  POSTOPERATIVE DIAGNOSIS: . Right breast cancer  PROCEDURE: . Right partial mastectomy with sentinel lymph node biopsy  ANESTHESIA:  General  SURGEON: Rochel Brome  MD   INDICATIONS: . She had recent findings of a mass in the lower outer quadrant of the areola of the right breast adjacent to the nipple. 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 Ultrasound measurement was 1.1 cm. Surgery was recommended for definitive treatment.  She did have preoperative injection of radioactive technetium sulfur colloid. The patient was placed on the operating table in the supine position under general anesthesia. The right arm was placed on a lateral arm support. The mass was easily palpable in the areola of the lower outer quadrant. The breast upper arm and surrounding chest wall were prepared with ChloraPrep and draped in a sterile manner.  A transversely oriented curvilinear ellipse of skin surrounding the mass and nipple and areola was begun with a scalpel and continued with electrocautery. The dissection was carried down around the palpable mass deep into the right breast. The lateral end of the skin ellipse was tagged with a 3-0 nylon suture for the pathologist orientation. The specimen was submitted for pathology to check for margins. It is noted that during the course of the dissection a number of small bleeding points were cauterized and hemostasis subsequently appeared to be intact.  Attention was turned to the right axilla which was probed with a gamma counter demonstrating the location of radioactivity in the inferior aspect of the axilla. An oblique incision was made at this site some 5 cm in length and carried down through subcutaneous tissues. Multiple small bleeding points were cauterized. Dissection was carried down through superficial fascia and found the site of the lymph node with a gamma counter.  The lymph node was resected with some surrounding fatty tissue the ex vivo measurement was greater than 1000 counts per second the background count was less than 15 counts per second there was no remaining palpable mass. The lymph node which was soft and small in size was submitted to the lab for routine pathology. The wound was inspected and could see hemostasis was intact  The  pathologist did call to report that the inferior margin appeared to be involved. Therefore the inferior margin was reexcised removing an ellipse of skin which was approximately 6 mm in width and dissected down around the inferior margin to remove another portion of tissue which was approximately 8 mm in thickness and approximately 4 x 3 cm in either dimensions.This was submitted so that the new margin was on Telfa and the lateral end of the skin ellipse was marked with a stitch. The pathologist later called back to report that the new inferior margin appeared to be  free of tumor.  The wound was inspected and determined hemostasis was intact. Both wounds were treated with injection of half percent Sensorcaine with epinephrine in the subcutaneous tissues. The subcutaneous tissues of the breast wound were approximated with interrupted 4-0 chromic. The skin was closed with running 4-0 Monocryl subcuticular suture. The axillary wound was inspected and could see hemostasis was intact this wound was also closed with a running Monocryl subcuticular suture.. Both wounds were treated with LiquiBand and allowed to dry. The patient appear to be in satisfactory condition and was then prepared for transfer to the recovery room  Davie.D.

## 2016-03-16 NOTE — Progress Notes (Signed)
  Oncology Nurse Navigator Documentation  Navigator Location: [facility] (03/16/16 1000) Navigator Encounter Type: Other (surgery) (03/16/16 1000)       Surgery Date: 03/16/16 (03/16/16 1000) Treatment Initiated Date: 03/16/16 (03/16/16 1000) Patient Visit Type: Surgery (03/16/16 1000)     Education: Understanding Cancer/ Treatment Options (03/16/16 1000) Interventions: Coordination of Care (03/16/16 1000)   Coordination of Care: Appts (03/16/16 1000)        Acuity: Level 2 (03/16/16 1000)         Time Spent with Patient: 30 (03/16/16 1000)   Met patient and her husband today prior to her surgery.  Patient still has lots of questions.  Answered a few.  Wants to know when to follow up with Dr. Grayland Ormond.  Appointment made for 03/29/16'@11'$ :45.  Left message on patient's voicemail with appointment.  Will also call her next week.

## 2016-03-16 NOTE — Anesthesia Procedure Notes (Signed)
Procedure Name: Intubation Performed by: Lance Muss Pre-anesthesia Checklist: Emergency Drugs available, Patient identified, Suction available, Patient being monitored and Timeout performed Patient Re-evaluated:Patient Re-evaluated prior to inductionOxygen Delivery Method: Circle system utilized Preoxygenation: Pre-oxygenation with 100% oxygen Intubation Type: IV induction Ventilation: Two handed mask ventilation required Laryngoscope Size: Mac and 3 Grade View: Grade I Tube type: Oral Tube size: 7.0 mm Number of attempts: 1 Airway Equipment and Method: Stylet Placement Confirmation: ETT inserted through vocal cords under direct vision,  positive ETCO2 and breath sounds checked- equal and bilateral Secured at: 21 cm Tube secured with: Tape Dental Injury: Teeth and Oropharynx as per pre-operative assessment

## 2016-03-16 NOTE — Transfer of Care (Signed)
Immediate Anesthesia Transfer of Care Note  Patient: Katherine Gates  Procedure(s) Performed: Procedure(s): PARTIAL MASTECTOMY (Right) SENTINEL LYMPH NODE BIOPSY (Right)  Patient Location: PACU  Anesthesia Type:General  Level of Consciousness: awake, alert  and oriented  Airway & Oxygen Therapy: Patient Spontanous Breathing and Patient connected to face mask oxygen  Post-op Assessment: Report given to RN and Post -op Vital signs reviewed and stable  Post vital signs: Reviewed and stable  Last Vitals:  Filed Vitals:   03/16/16 0856 03/16/16 1454  BP: 190/73 156/73  Pulse: 72 79  Temp: 37 C   Resp: 16 12    Complications: No apparent anesthesia complications

## 2016-03-16 NOTE — H&P (Signed)
  She reports no change in condition since the day of the office visit. I discussed the plan for right partial mastectomy which will include the nipple and areola. Also discussed the sentinel lymph node biopsy. The right side is marked YES

## 2016-03-16 NOTE — Anesthesia Postprocedure Evaluation (Signed)
Anesthesia Post Note  Patient: Katherine Gates  Procedure(s) Performed: Procedure(s) (LRB): PARTIAL MASTECTOMY (Right) SENTINEL LYMPH NODE BIOPSY (Right)  Patient location during evaluation: PACU Anesthesia Type: General Level of consciousness: awake and alert Pain management: pain level controlled Vital Signs Assessment: post-procedure vital signs reviewed and stable Respiratory status: spontaneous breathing and respiratory function stable Cardiovascular status: stable Anesthetic complications: no    Last Vitals:  Filed Vitals:   03/16/16 1454 03/16/16 1508  BP: 156/73 139/79  Pulse: 79 74  Temp:    Resp: 12 13    Last Pain: There were no vitals filed for this visit.               Charletha Dalpe K

## 2016-03-16 NOTE — Discharge Instructions (Addendum)
Take Tylenol or Norco if needed for pain.  May resume aspirin on Monday.  May shower.  May wear bra as desired for comfort and support.  AMBULATORY SURGERY  DISCHARGE INSTRUCTIONS   1) The drugs that you were given will stay in your system until tomorrow so for the next 24 hours you should not:  A) Drive an automobile B) Make any legal decisions C) Drink any alcoholic beverage   2) You may resume regular meals tomorrow.  Today it is better to start with liquids and gradually work up to solid foods.  You may eat anything you prefer, but it is better to start with liquids, then soup and crackers, and gradually work up to solid foods.   3) Please notify your doctor immediately if you have any unusual bleeding, trouble breathing, redness and pain at the surgery site, drainage, fever, or pain not relieved by medication.    4) Additional Instructions:        Please contact your physician with any problems or Same Day Surgery at [phone], Monday through Friday 6 am to 4 pm, or [place] at [facility] number at [phone].AMBULATORY SURGERY  DISCHARGE INSTRUCTIONS   5) The drugs that you were given will stay in your system until tomorrow so for the next 24 hours you should not:  D) Drive an automobile E) Make any legal decisions F) Drink any alcoholic beverage   6) You may resume regular meals tomorrow.  Today it is better to start with liquids and gradually work up to solid foods.  You may eat anything you prefer, but it is better to start with liquids, then soup and crackers, and gradually work up to solid foods.   7) Please notify your doctor immediately if you have any unusual bleeding, trouble breathing, redness and pain at the surgery site, drainage, fever, or pain not relieved by medication.    8) Additional Instructions:        Please contact your physician with any problems or Same Day Surgery at [phone], Monday through Friday 6 am to 4  pm, or [place] at [facility] number at [phone].AMBULATORY SURGERY  DISCHARGE INSTRUCTIONS   9) The drugs that you were given will stay in your system until tomorrow so for the next 24 hours you should not:  G) Drive an automobile H) Make any legal decisions I) Drink any alcoholic beverage   10) You may resume regular meals tomorrow.  Today it is better to start with liquids and gradually work up to solid foods.  You may eat anything you prefer, but it is better to start with liquids, then soup and crackers, and gradually work up to solid foods.   11) Please notify your doctor immediately if you have any unusual bleeding, trouble breathing, redness and pain at the surgery site, drainage, fever, or pain not relieved by medication.    12) Additional Instructions:        Please contact your physician with any problems or Same Day Surgery at [phone], Monday through Friday 6 am to 4 pm, or [place] at [facility] number at [phone].

## 2016-03-19 ENCOUNTER — Encounter: Payer: Self-pay | Admitting: Surgery

## 2016-03-19 LAB — SURGICAL PATHOLOGY

## 2016-03-28 ENCOUNTER — Ambulatory Visit: Payer: Medicare Other | Admitting: Oncology

## 2016-03-29 ENCOUNTER — Inpatient Hospital Stay: Payer: Medicare Other | Attending: Oncology | Admitting: Oncology

## 2016-03-29 ENCOUNTER — Encounter: Payer: Self-pay | Admitting: *Deleted

## 2016-03-29 VITALS — BP 156/80 | HR 71 | Resp 18 | Wt 154.8 lb

## 2016-03-29 DIAGNOSIS — Z79899 Other long term (current) drug therapy: Secondary | ICD-10-CM

## 2016-03-29 DIAGNOSIS — K219 Gastro-esophageal reflux disease without esophagitis: Secondary | ICD-10-CM | POA: Insufficient documentation

## 2016-03-29 DIAGNOSIS — M1990 Unspecified osteoarthritis, unspecified site: Secondary | ICD-10-CM | POA: Diagnosis not present

## 2016-03-29 DIAGNOSIS — Z7982 Long term (current) use of aspirin: Secondary | ICD-10-CM | POA: Insufficient documentation

## 2016-03-29 DIAGNOSIS — C50511 Malignant neoplasm of lower-outer quadrant of right female breast: Secondary | ICD-10-CM | POA: Diagnosis not present

## 2016-03-29 DIAGNOSIS — Z170 Estrogen receptor positive status [ER+]: Secondary | ICD-10-CM | POA: Insufficient documentation

## 2016-03-29 DIAGNOSIS — Z85820 Personal history of malignant melanoma of skin: Secondary | ICD-10-CM | POA: Insufficient documentation

## 2016-03-29 DIAGNOSIS — C50911 Malignant neoplasm of unspecified site of right female breast: Secondary | ICD-10-CM

## 2016-03-29 DIAGNOSIS — K589 Irritable bowel syndrome without diarrhea: Secondary | ICD-10-CM | POA: Diagnosis not present

## 2016-03-29 NOTE — Progress Notes (Signed)
Patient had right partial mastectomy on 03/16/16 and is here to discuss treatment plan.

## 2016-03-29 NOTE — Progress Notes (Signed)
Order for [employer] faxed to [place].

## 2016-03-30 ENCOUNTER — Telehealth: Payer: Self-pay | Admitting: *Deleted

## 2016-03-30 ENCOUNTER — Other Ambulatory Visit: Payer: Self-pay | Admitting: *Deleted

## 2016-03-30 NOTE — Telephone Encounter (Signed)
Follow up call placed to [facility] regarding mammaprint testing. Left VM for patients representative.

## 2016-03-30 NOTE — Telephone Encounter (Signed)
-----   Message from Vanice Sarah, [place] sent at 03/29/2016  4:32 PM EDT ----- Calling from [place] regarding section 9 on mammaprint order form. Please return call to [phone] ext [phone]

## 2016-04-01 NOTE — Progress Notes (Signed)
[place]  [phone] Fax:([phone]) [phone]  ID: RUTH KOVICH OB: June 20, 1933  MR#: [id number]  QQV#:[id number]  Patient Care Team: Idelle Crouch, MD as PCP - General (Internal Medicine)  CHIEF COMPLAINT:  Chief Complaint  Patient presents with  . Breast Cancer    INTERVAL HISTORY: Patient returns to clinic today for further evaluation and discussion of her pathology results. She currently feels well and is asymptomatic. She has no neurologic complaints. She denies any fevers. She has a good appetite and denies weight loss. She denies any pain. She has no chest pain or shortness of breath. She denies any nausea, vomiting, constipation, or diarrhea. She has no urinary complaints. Patient feels at her baseline and offers no specific complaints today.  REVIEW OF SYSTEMS:   Review of Systems  Constitutional: Negative.  Negative for fever, weight loss and malaise/fatigue.  Respiratory: Negative.  Negative for sputum production.   Cardiovascular: Negative.  Negative for chest pain.  Gastrointestinal: Negative.   Genitourinary: Negative.   Neurological: Negative.  Negative for weakness.  Psychiatric/Behavioral: The patient is not nervous/anxious.     As per HPI. Otherwise, a complete review of systems is negatve.  PAST MEDICAL HISTORY: Past Medical History  Diagnosis Date  . Dermatitis contact, eyelid   . Rosacea   . Bell's palsy   . Melanoma of skin ([place]) 2011    left upper arm  . Osteoarthritis   . Fibrocystic breast disease     being followed at [facility]   . IBS (irritable bowel syndrome)   . Environmental allergies   . GERD (gastroesophageal reflux disease)     PAST SURGICAL HISTORY: Past Surgical History  Procedure Laterality Date  . Eyelid lift    . Repair tear ducts    . Endoscopic carpal tunnel release Right   . Abdominal hysterectomy    . Partial mastectomy with needle localization Right 03/16/2016    Procedure: PARTIAL MASTECTOMY;   Surgeon: Leonie Green, MD;  Location: [facility] ORS;  Service: General;  Laterality: Right;  . Sentinel node biopsy Right 03/16/2016    Procedure: SENTINEL LYMPH NODE BIOPSY;  Surgeon: Leonie Green, MD;  Location: [facility] ORS;  Service: General;  Laterality: Right;    FAMILY HISTORY Family History  Problem Relation Age of Onset  . Breast cancer Neg Hx   . Skin cancer Brother     non-melanoma  . Skin cancer Father     non-melanoma  . Throat cancer Father   . Stroke Mother        ADVANCED DIRECTIVES:    HEALTH MAINTENANCE: Social History  Substance Use Topics  . Smoking status: Never Smoker   . Smokeless tobacco: Not on file  . Alcohol Use: 0.0 oz/week    0 Standard drinks or equivalent per week     Colonoscopy:  PAP:  Bone density:  Lipid panel:  Allergies  Allergen Reactions  . Benzalkonium Chloride Other (See Comments)    Positive patch test  . Garlic Diarrhea  . Methylisothiazolinone Other (See Comments)    Positive patch test Positive patch test    Current Outpatient Prescriptions  Medication Sig Dispense Refill  . acetaminophen (TYLENOL) 500 MG tablet Take 500 mg by mouth every 6 (six) hours as needed.    Marland Kitchen aspirin EC 81 MG tablet Take by mouth.    . Biotin 5000 MCG TABS Take 1 tablet by mouth daily.    . Cholecalciferol (VITAMIN D3) 1000 units CAPS Take by mouth  daily.    . fexofenadine (ALLEGRA) 180 MG tablet Take by mouth.    . fluticasone (FLONASE) 50 MCG/ACT nasal spray Place 1 spray into the nose daily.     Marland Kitchen gentamicin ointment (GARAMYCIN) 0.1 %     . HYDROcodone-acetaminophen (NORCO) 5-325 MG tablet Take 1-2 tablets by mouth every 4 (four) hours as needed for moderate pain. 12 tablet 0  . metroNIDAZOLE (METROGEL) 0.75 % gel Apply 1 application topically 2 (two) times daily.     . naproxen sodium (ANAPROX) 220 MG tablet Take 220 mg by mouth 2 (two) times daily with a meal.    . Optical Supplies (OCUSOFT DRY EYE) KIT by Does not apply route.      Marland Kitchen Propylene Glycol (SYSTANE BALANCE OP) Apply 1 drop to eye 2 (two) times daily.    . RABEprazole (ACIPHEX) 20 MG tablet Take 20 mg by mouth 2 (two) times daily.      No current facility-administered medications for this visit.    OBJECTIVE: Filed Vitals:   03/29/16 1211  BP: 156/80  Pulse: 71  Resp: 18     Body mass index is 27.42 kg/(m^2).    ECOG FS:0 - Asymptomatic  General: Well-developed, well-nourished, no acute distress. Eyes: Pink conjunctiva, anicteric sclera. Breasts: Patient requested exam be deferred today. Lungs: Clear to auscultation bilaterally. Heart: Regular rate and rhythm. No rubs, murmurs, or gallops. Abdomen: Soft, nontender, nondistended. No organomegaly noted, normoactive bowel sounds. Musculoskeletal: No edema, cyanosis, or clubbing. Neuro: Alert, answering all questions appropriately. Cranial nerves grossly intact. Skin: No rashes or petechiae noted. Psych: Normal affect.   LAB RESULTS:  Lab Results  Component Value Date   NA 139 03/08/2016   K 3.9 03/08/2016   CL 105 03/08/2016   CO2 26 03/08/2016   GLUCOSE 104* 03/08/2016   BUN 21* 03/08/2016   CREATININE 0.94 03/08/2016   CALCIUM 9.3 03/08/2016   PROT 7.0 03/08/2016   ALBUMIN 3.8 03/08/2016   AST 59* 03/08/2016   ALT 41 03/08/2016   ALKPHOS 74 03/08/2016   BILITOT 0.5 03/08/2016   GFRNONAA 55* 03/08/2016   GFRAA >60 03/08/2016    Lab Results  Component Value Date   WBC 7.5 03/08/2016   NEUTROABS 5.3 03/08/2016   HGB 13.5 03/08/2016   HCT 38.9 03/08/2016   MCV 92.7 03/08/2016   PLT 232 03/08/2016     STUDIES: Nm Sentinel Node Inj-no Rpt (breast)  03/16/2016  CLINICAL DATA: RIGHT BREAST CA Sulfur colloid was injected intradermally by the nuclear medicine technologist for breast cancer sentinel node localization.    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  PLAN:    1. Breast cancer: Although patient is 82 years old,  she may benefit from adjuvant chemotherapy given the speed at which this tumor arose. We were unable to order Mammoprint on her original biopsy given lack of tissue, now that she has had her lumpectomy will proceed with testing to assess whether adjuvant chemotherapy is necessary.  Return to clinic in 2 weeks to discuss her final results. She will also have consultation with radiation oncology at that time. She will also benefit from an aromatase inhibitor for 5 years given the ER/PR status of her tumor.  Approximately 30 minutes was spent in discussion of which greater than 50% was consultation.  Patient expressed understanding and was in agreement with this plan. She also understands that She can call clinic at any time with any questions, concerns, or complaints.  Lloyd Huger, MD   04/01/2016 11:06 PM

## 2016-04-17 ENCOUNTER — Encounter: Payer: Self-pay | Admitting: Oncology

## 2016-05-02 ENCOUNTER — Inpatient Hospital Stay: Payer: Medicare Other | Attending: Oncology | Admitting: Oncology

## 2016-05-02 ENCOUNTER — Ambulatory Visit
Admission: RE | Admit: 2016-05-02 | Discharge: 2016-05-02 | Disposition: A | Discharge status: Home / Self Care | Payer: Medicare Other | Source: Ambulatory Visit | Attending: Radiation Oncology | Admitting: Radiation Oncology

## 2016-05-02 ENCOUNTER — Encounter: Payer: Self-pay | Admitting: Radiation Oncology

## 2016-05-02 VITALS — BP 142/73 | HR 68 | Temp 97.1°F | Resp 18 | Wt 155.9 lb

## 2016-05-02 DIAGNOSIS — M1990 Unspecified osteoarthritis, unspecified site: Secondary | ICD-10-CM | POA: Diagnosis not present

## 2016-05-02 DIAGNOSIS — K219 Gastro-esophageal reflux disease without esophagitis: Secondary | ICD-10-CM | POA: Insufficient documentation

## 2016-05-02 DIAGNOSIS — Z170 Estrogen receptor positive status [ER+]: Secondary | ICD-10-CM | POA: Insufficient documentation

## 2016-05-02 DIAGNOSIS — Z510 Encounter for antineoplastic radiation therapy: Secondary | ICD-10-CM | POA: Insufficient documentation

## 2016-05-02 DIAGNOSIS — K589 Irritable bowel syndrome without diarrhea: Secondary | ICD-10-CM | POA: Insufficient documentation

## 2016-05-02 DIAGNOSIS — Z7982 Long term (current) use of aspirin: Secondary | ICD-10-CM | POA: Diagnosis not present

## 2016-05-02 DIAGNOSIS — C50011 Malignant neoplasm of nipple and areola, right female breast: Secondary | ICD-10-CM

## 2016-05-02 DIAGNOSIS — C50111 Malignant neoplasm of central portion of right female breast: Secondary | ICD-10-CM | POA: Insufficient documentation

## 2016-05-02 DIAGNOSIS — Z79899 Other long term (current) drug therapy: Secondary | ICD-10-CM | POA: Diagnosis not present

## 2016-05-02 DIAGNOSIS — C50911 Malignant neoplasm of unspecified site of right female breast: Secondary | ICD-10-CM | POA: Diagnosis not present

## 2016-05-02 DIAGNOSIS — Z85820 Personal history of malignant melanoma of skin: Secondary | ICD-10-CM | POA: Insufficient documentation

## 2016-05-02 NOTE — Consult Note (Signed)
Except an outstanding is perfect of Radiation Oncology NEW PATIENT EVALUATION  Name: Katherine Gates  MRN: [id number]  Date:   05/02/2016     DOB: 1933-05-15   This 82 y.o. female patient presents to the clinic for initial evaluation of stage I (T1 CN 0 M0) ER/PR positive HER-2/neu negative invasive mammary carcinoma of the right breast status post wide local excision with sacrifice of nipple areolar complex with low risk of recurrence by mouth per for whole breast radiation.  REFERRING PHYSICIAN: Idelle Crouch, MD  CHIEF COMPLAINT: No chief complaint on file.   DIAGNOSIS: The encounter diagnosis was Malignant neoplasm involving both nipple and areola of right breast in female [facility]).   PREVIOUS INVESTIGATIONS:  Mammograms and ultrasound reviewed Clinical notes reviewed Pathology report reviewed  HPI: Patient is a 82 year old female who presented with a self discovered mass which came up suddenly in the region of her right nipple. She had a negative mammogram back in March 2017. She underwent repeat mammogram and ultrasound showing a mass around the nipple areolar complex at 7:00 position which prompted ultrasound-guided biopsy. Biopsy was positive for adenocarcinoma overall grade 2. This prompted wide local excision and sentinel node biopsy. Tumor was 1.4 cm overall grade 2 with margins clear at 1 cm. 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 One sentinel lymph node was negative for metastatic disease. Tumor was ER/PR positive HER-2/neu not overexpressed. MammaPrint was performed showing low risk of recurrence at 5% over 5 years. Recommendation has been made for adjuvant hormonal ablation therapy. She is seen today for radiation oncology consultation. She is doing well. She specifically denies breast tenderness cough or bone pain. She is well-healed from wide local excision including the nipple areolar complex.  PLANNED TREATMENT REGIMEN: Whole breast radiation  PAST MEDICAL HISTORY:  has a past  medical history of Dermatitis contact, eyelid; Rosacea; Bell's palsy; Melanoma of skin ([place]) (2011); Osteoarthritis; Fibrocystic breast disease; IBS (irritable bowel syndrome); Environmental allergies; and GERD (gastroesophageal reflux disease).    PAST SURGICAL HISTORY:  Past Surgical History  Procedure Laterality Date  . Eyelid lift    . Repair tear ducts    . Endoscopic carpal tunnel release Right   . Abdominal hysterectomy    . Partial mastectomy with needle localization Right 03/16/2016    Procedure: PARTIAL MASTECTOMY;  Surgeon: Leonie Green, MD;  Location: [facility] ORS;  Service: General;  Laterality: Right;  . Sentinel node biopsy Right 03/16/2016    Procedure: SENTINEL LYMPH NODE BIOPSY;  Surgeon: Leonie Green, MD;  Location: [facility] ORS;  Service: General;  Laterality: Right;    FAMILY HISTORY: family history includes Skin cancer in her brother and father; Stroke in her mother; Throat cancer in her father. There is no history of Breast cancer.  SOCIAL HISTORY:  reports that she has never smoked. She does not have any smokeless tobacco history on file. She reports that she drinks alcohol. She reports that she does not use illicit drugs.  ALLERGIES: Benzalkonium chloride; Garlic; and Methylisothiazolinone  MEDICATIONS:  Current Outpatient Prescriptions  Medication Sig Dispense Refill  . acetaminophen (TYLENOL) 500 MG tablet Take 500 mg by mouth every 6 (six) hours as needed.    Marland Kitchen aspirin EC 81 MG tablet Take by mouth.    . Biotin 5000 MCG TABS Take 1 tablet by mouth daily.    . Cholecalciferol (VITAMIN D3) 1000 units CAPS Take by mouth daily.    . fexofenadine (ALLEGRA) 180 MG tablet Take by mouth.    Marland Kitchen  fluticasone (FLONASE) 50 MCG/ACT nasal spray Place 1 spray into the nose daily.     Marland Kitchen gentamicin ointment (GARAMYCIN) 0.1 %     . HYDROcodone-acetaminophen (NORCO) 5-325 MG tablet Take 1-2 tablets by mouth every 4 (four) hours as needed for moderate pain. 12 tablet 0  .  hyoscyamine (LEVBID) 0.375 MG 12 hr tablet Take by mouth.    . metroNIDAZOLE (METROGEL) 0.75 % gel Apply 1 application topically 2 (two) times daily.     . naproxen sodium (ANAPROX) 220 MG tablet Take 220 mg by mouth 2 (two) times daily with a meal.    . Optical Supplies (OCUSOFT DRY EYE) KIT by Does not apply route.    Marland Kitchen Propylene Glycol (SYSTANE BALANCE OP) Apply 1 drop to eye 2 (two) times daily.    . RABEprazole (ACIPHEX) 20 MG tablet Take 20 mg by mouth 2 (two) times daily.      No current facility-administered medications for this encounter.    ECOG PERFORMANCE STATUS:  0 - Asymptomatic  REVIEW OF SYSTEMS:  Patient denies any weight loss, fatigue, weakness, fever, chills or night sweats. Patient denies any loss of vision, blurred vision. Patient denies any ringing  of the ears or hearing loss. No irregular heartbeat. Patient denies heart murmur or history of fainting. Patient denies any chest pain or pain radiating to her upper extremities. Patient denies any shortness of breath, difficulty breathing at night, cough or hemoptysis. Patient denies any swelling in the lower legs. Patient denies any nausea vomiting, vomiting of blood, or coffee ground material in the vomitus. Patient denies any stomach pain. Patient states has had normal bowel movements no significant constipation or diarrhea. Patient denies any dysuria, hematuria or significant nocturia. Patient denies any problems walking, swelling in the joints or loss of balance. Patient denies any skin changes, loss of hair or loss of weight. Patient denies any excessive worrying or anxiety or significant depression. Patient denies any problems with insomnia. Patient denies excessive thirst, polyuria, polydipsia. Patient denies any swollen glands, patient denies easy bruising or easy bleeding. Patient denies any recent infections, allergies or URI. Patient "s visual fields have not changed significantly in recent time.    PHYSICAL EXAM: There  were no vitals taken for this visit. She has sacrifice of the right nipple areolar complex. No dominant mass or nodularity is noted in either breast in 2 positions examined. No axillary or supraclavicular adenopathy is appreciated. Well-developed well-nourished patient in NAD. HEENT reveals PERLA, EOMI, discs not visualized.  Oral cavity is clear. No oral mucosal lesions are identified. Neck is clear without evidence of cervical or supraclavicular adenopathy. Lungs are clear to A&P. Cardiac examination is essentially unremarkable with regular rate and rhythm without murmur rub or thrill. Abdomen is benign with no organomegaly or masses noted. Motor sensory and DTR levels are equal and symmetric in the upper and lower extremities. Cranial nerves II through XII are grossly intact. Proprioception is intact. No peripheral adenopathy or edema is identified. No motor or sensory levels are noted. Crude visual fields are within normal range.  LABORATORY DATA: Pathology reports reviewed    RADIOLOGY RESULTS: Mammograms and ultrasounds are reviewed   IMPRESSION: Stage I invasive mammary carcinoma of the right breast status post wide local excision and sentinel node biopsy ER/PR positive HER-2/neu negative with low risk of recurrence by MammaPrint  PLAN: At this time I to go ahead with whole breast radiation. She has rather large breast making hypofractionated course of treatment difficult. Would  plan on delivering 5000 cGy over 5 weeks boosting her scar another 1400 cGy using electron beam. Risks and benefits of treatment including skin reaction fatigue alteration of blood counts possible inclusion of superficial lung all were explained in detail to the patient and her husband. They both seem to comprehend my treatment plan well. I first set her up and ordered CT simulation for the middle of June when she returns from a cruise with her husband. Patient also will be candidate for hormonal ablation therapy after  completion of radiation. Patient knows to call with any concerns.  I would like to take this opportunity to thank you for allowing me to participate in the care of your patient.Armstead Peaks., MD

## 2016-05-06 NOTE — Progress Notes (Signed)
[place]  [phone]) [phone] [phone]) [phone]  ID: Katherine Gates OB: 19-Aug-1933  MR#: [id number]  XOV#:[id number]  Patient Care Team: Idelle Crouch, MD as PCP - General (Internal Medicine)  CHIEF COMPLAINT:  Chief Complaint  Patient presents with  . Breast Cancer    INTERVAL HISTORY: Patient returns to clinic today for further evaluation, discussion of her mammoprint results, and treatment planning. She continues to feel well and is asymptomatic. She has no neurologic complaints. She denies any fevers. She has a good appetite and denies weight loss. She denies any pain. She has no chest pain or shortness of breath. She denies any nausea, vomiting, constipation, or diarrhea. She has no urinary complaints. Patient offers no specific complaints today.  REVIEW OF SYSTEMS:   Review of Systems  Constitutional: Negative.  Negative for fever, weight loss and malaise/fatigue.  Respiratory: Negative.  Negative for sputum production.   Cardiovascular: Negative.  Negative for chest pain.  Gastrointestinal: Negative.   Genitourinary: Negative.   Neurological: Negative.  Negative for weakness.  Psychiatric/Behavioral: The patient is not nervous/anxious.     As per HPI. Otherwise, a complete review of systems is negatve.  PAST MEDICAL HISTORY: Past Medical History  Diagnosis Date  . Dermatitis contact, eyelid   . Rosacea   . Bell's palsy   . Melanoma of skin ([place]) 2011    left upper arm  . Osteoarthritis   . Fibrocystic breast disease     being followed at [facility]   . IBS (irritable bowel syndrome)   . Environmental allergies   . GERD (gastroesophageal reflux disease)     PAST SURGICAL HISTORY: Past Surgical History  Procedure Laterality Date  . Eyelid lift    . Repair tear ducts    . Endoscopic carpal tunnel release Right   . Abdominal hysterectomy    . Partial mastectomy with needle localization Right 03/16/2016    Procedure: PARTIAL MASTECTOMY;   Surgeon: Leonie Green, MD;  Location: [facility] ORS;  Service: General;  Laterality: Right;  . Sentinel node biopsy Right 03/16/2016    Procedure: SENTINEL LYMPH NODE BIOPSY;  Surgeon: Leonie Green, MD;  Location: [facility] ORS;  Service: General;  Laterality: Right;    FAMILY HISTORY Family History  Problem Relation Age of Onset  . Breast cancer Neg Hx   . Skin cancer Brother     non-melanoma  . Skin cancer Father     non-melanoma  . Throat cancer Father   . Stroke Mother        ADVANCED DIRECTIVES:    HEALTH MAINTENANCE: Social History  Substance Use Topics  . Smoking status: Never Smoker   . Smokeless tobacco: Not on file  . Alcohol Use: 0.0 oz/week    0 Standard drinks or equivalent per week     Colonoscopy:  PAP:  Bone density:  Lipid panel:  Allergies  Allergen Reactions  . Benzalkonium Chloride Other (See Comments)    Positive patch test  . Garlic Diarrhea  . Methylisothiazolinone Other (See Comments)    Positive patch test Positive patch test    Current Outpatient Prescriptions  Medication Sig Dispense Refill  . acetaminophen (TYLENOL) 500 MG tablet Take 500 mg by mouth every 6 (six) hours as needed.    Marland Kitchen aspirin EC 81 MG tablet Take by mouth.    . Biotin 5000 MCG TABS Take 1 tablet by mouth daily.    . Cholecalciferol (VITAMIN D3) 1000 units CAPS Take by mouth daily.    Marland Kitchen  fexofenadine (ALLEGRA) 180 MG tablet Take by mouth.    . fluticasone (FLONASE) 50 MCG/ACT nasal spray Place 1 spray into the nose daily.     Marland Kitchen gentamicin ointment (GARAMYCIN) 0.1 %     . HYDROcodone-acetaminophen (NORCO) 5-325 MG tablet Take 1-2 tablets by mouth every 4 (four) hours as needed for moderate pain. 12 tablet 0  . hyoscyamine (LEVBID) 0.375 MG 12 hr tablet Take by mouth.    . metroNIDAZOLE (METROGEL) 0.75 % gel Apply 1 application topically 2 (two) times daily.     . naproxen sodium (ANAPROX) 220 MG tablet Take 220 mg by mouth 2 (two) times daily with a meal.    .  Optical Supplies (OCUSOFT DRY EYE) KIT by Does not apply route.    Marland Kitchen Propylene Glycol (SYSTANE BALANCE OP) Apply 1 drop to eye 2 (two) times daily.    . RABEprazole (ACIPHEX) 20 MG tablet Take 20 mg by mouth 2 (two) times daily.      No current facility-administered medications for this visit.    OBJECTIVE: Filed Vitals:   05/02/16 0912  BP: 142/73  Pulse: 68  Temp: 97.1 F (36.2 C)  Resp: 18     Body mass index is 27.62 kg/(m^2).    ECOG FS:0 - Asymptomatic  General: Well-developed, well-nourished, no acute distress. Eyes: Pink conjunctiva, anicteric sclera. Breasts: Patient requested exam be deferred today. Lungs: Clear to auscultation bilaterally. Heart: Regular rate and rhythm. No rubs, murmurs, or gallops. Abdomen: Soft, nontender, nondistended. No organomegaly noted, normoactive bowel sounds. Musculoskeletal: No edema, cyanosis, or clubbing. Neuro: Alert, answering all questions appropriately. Cranial nerves grossly intact. Skin: No rashes or petechiae noted. Psych: Normal affect.   LAB RESULTS:  Lab Results  Component Value Date   NA 139 03/08/2016   K 3.9 03/08/2016   CL 105 03/08/2016   CO2 26 03/08/2016   GLUCOSE 104* 03/08/2016   BUN 21* 03/08/2016   CREATININE 0.94 03/08/2016   CALCIUM 9.3 03/08/2016   PROT 7.0 03/08/2016   ALBUMIN 3.8 03/08/2016   AST 59* 03/08/2016   ALT 41 03/08/2016   ALKPHOS 74 03/08/2016   BILITOT 0.5 03/08/2016   GFRNONAA 55* 03/08/2016   GFRAA >60 03/08/2016    Lab Results  Component Value Date   WBC 7.5 03/08/2016   NEUTROABS 5.3 03/08/2016   HGB 13.5 03/08/2016   HCT 38.9 03/08/2016   MCV 92.7 03/08/2016   PLT 232 03/08/2016     STUDIES: No results found.  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 Mammoprint low risk.  PLAN:    1. Breast cancer: Final pathology results as above with a low risk Mammoprint therefore chemotherapy is not necessary. Patient had  an appointment with radiation oncology today to discuss adjuvant XRT. Patient will return to clinic in approximately 8-10 weeks near the conclusion of her XRT for further evaluation and initiation of an aromatase inhibitor which she will be required to take for 5 years.  Approximately 30 minutes was spent in discussion of which greater than 50% was consultation.   Patient expressed understanding and was in agreement with this plan. She also understands that She can call clinic at any time with any questions, concerns, or complaints.    Lloyd Huger, MD   05/06/2016 4:19 PM

## 2016-05-21 ENCOUNTER — Ambulatory Visit
Admission: RE | Admit: 2016-05-21 | Discharge: 2016-05-21 | Disposition: A | Discharge status: Home / Self Care | Payer: Medicare Other | Source: Ambulatory Visit | Attending: Radiation Oncology | Admitting: Radiation Oncology

## 2016-05-21 DIAGNOSIS — C50111 Malignant neoplasm of central portion of right female breast: Secondary | ICD-10-CM | POA: Diagnosis not present

## 2016-05-21 DIAGNOSIS — Z510 Encounter for antineoplastic radiation therapy: Secondary | ICD-10-CM | POA: Diagnosis not present

## 2016-05-21 DIAGNOSIS — Z170 Estrogen receptor positive status [ER+]: Secondary | ICD-10-CM | POA: Diagnosis not present

## 2016-05-22 DIAGNOSIS — C50111 Malignant neoplasm of central portion of right female breast: Secondary | ICD-10-CM | POA: Diagnosis not present

## 2016-05-25 ENCOUNTER — Other Ambulatory Visit: Payer: Self-pay | Admitting: *Deleted

## 2016-05-25 DIAGNOSIS — C50011 Malignant neoplasm of nipple and areola, right female breast: Secondary | ICD-10-CM

## 2016-05-28 ENCOUNTER — Ambulatory Visit
Admission: RE | Admit: 2016-05-28 | Discharge: 2016-05-28 | Disposition: A | Discharge status: Home / Self Care | Payer: Medicare Other | Source: Ambulatory Visit | Attending: Radiation Oncology | Admitting: Radiation Oncology

## 2016-05-28 ENCOUNTER — Encounter: Payer: Self-pay | Admitting: Emergency Medicine

## 2016-05-28 ENCOUNTER — Emergency Department
Admission: EM | Admit: 2016-05-28 | Discharge: 2016-05-28 | Disposition: A | Discharge status: Home / Self Care | Payer: Medicare Other | Attending: Emergency Medicine | Admitting: Emergency Medicine

## 2016-05-28 DIAGNOSIS — Z7982 Long term (current) use of aspirin: Secondary | ICD-10-CM | POA: Insufficient documentation

## 2016-05-28 DIAGNOSIS — M5432 Sciatica, left side: Secondary | ICD-10-CM

## 2016-05-28 DIAGNOSIS — Z853 Personal history of malignant neoplasm of breast: Secondary | ICD-10-CM | POA: Insufficient documentation

## 2016-05-28 DIAGNOSIS — R110 Nausea: Secondary | ICD-10-CM | POA: Diagnosis not present

## 2016-05-28 DIAGNOSIS — Z85820 Personal history of malignant melanoma of skin: Secondary | ICD-10-CM | POA: Insufficient documentation

## 2016-05-28 DIAGNOSIS — Z79899 Other long term (current) drug therapy: Secondary | ICD-10-CM | POA: Diagnosis not present

## 2016-05-28 DIAGNOSIS — C50111 Malignant neoplasm of central portion of right female breast: Secondary | ICD-10-CM | POA: Diagnosis not present

## 2016-05-28 DIAGNOSIS — M545 Low back pain: Secondary | ICD-10-CM | POA: Diagnosis present

## 2016-05-28 DIAGNOSIS — M5442 Lumbago with sciatica, left side: Secondary | ICD-10-CM | POA: Diagnosis not present

## 2016-05-28 DIAGNOSIS — M1990 Unspecified osteoarthritis, unspecified site: Secondary | ICD-10-CM | POA: Insufficient documentation

## 2016-05-28 MED ORDER — ONDANSETRON 4 MG PO TBDP
ORAL_TABLET | ORAL | Status: AC
  Administered 2016-05-28: 4 mg via ORAL
  Filled 2016-05-28: qty 1

## 2016-05-28 MED ORDER — METHYLPREDNISOLONE SODIUM SUCC 125 MG IJ SOLR
80.0000 mg | Freq: Once | INTRAMUSCULAR | Status: AC
  Administered 2016-05-28: 80 mg via INTRAMUSCULAR
  Filled 2016-05-28: qty 2

## 2016-05-28 MED ORDER — IBUPROFEN 400 MG PO TABS
400.0000 mg | ORAL_TABLET | Freq: Once | ORAL | Status: AC | PRN
  Administered 2016-05-28: 400 mg via ORAL

## 2016-05-28 MED ORDER — ONDANSETRON 4 MG PO TBDP
4.0000 mg | ORAL_TABLET | Freq: Once | ORAL | Status: AC
  Administered 2016-05-28: 4 mg via ORAL

## 2016-05-28 MED ORDER — IBUPROFEN 400 MG PO TABS
ORAL_TABLET | ORAL | Status: AC
  Filled 2016-05-28: qty 1

## 2016-05-28 NOTE — ED Notes (Signed)
Warm blankets provided for comfort. Pt concerned regarding wait time. Explanation of treatment process provided to pt and spouse. Pt states, "just please hurry, i dont want to wait too long".

## 2016-05-28 NOTE — ED Notes (Signed)
Pt states 3 days of left sided low back pain and posterior leg pain. Cms intact to legs. Pt states 'it hurts so bad i can't stand it". Pt denies loss of control of bowel or bladder. Pt declines offer for opiate, agrees to ibuprofen in triage.

## 2016-05-28 NOTE — Discharge Instructions (Signed)
Back Exercises If you have pain in your back, do these exercises 2-3 times each day or as told by your doctor. When the pain goes away, do the exercises once each day, but repeat the steps more times for each exercise (do more repetitions). If you do not have pain in your back, do these exercises once each day or as told by your doctor. EXERCISES Single Knee to Chest Do these steps 3-5 times in a row for each leg:  Lie on your back on a firm bed or the floor with your legs stretched out.  Bring one knee to your chest.  Hold your knee to your chest by grabbing your knee or thigh.  Pull on your knee until you feel a gentle stretch in your lower back.  Keep doing the stretch for 10-30 seconds.  Slowly let go of your leg and straighten it. Pelvic Tilt Do these steps 5-10 times in a row:  Lie on your back on a firm bed or the floor with your legs stretched out.  Bend your knees so they point up to the ceiling. Your feet should be flat on the floor.  Tighten your lower belly (abdomen) muscles to press your lower back against the floor. This will make your tailbone point up to the ceiling instead of pointing down to your feet or the floor.  Stay in this position for 5-10 seconds while you gently tighten your muscles and breathe evenly. Cat-Cow Do these steps until your lower back bends more easily:  Get on your hands and knees on a firm surface. Keep your hands under your shoulders, and keep your knees under your hips. You may put padding under your knees.  Let your head hang down, and make your tailbone point down to the floor so your lower back is round like the back of a cat.  Stay in this position for 5 seconds.  Slowly lift your head and make your tailbone point up to the ceiling so your back hangs low (sags) like the back of a cow.  Stay in this position for 5 seconds. Press-Ups Do these steps 5-10 times in a row: 1. Lie on your belly (face-down) on the floor. 2. Place your  hands near your head, about shoulder-width apart. 3. While you keep your back relaxed and keep your hips on the floor, slowly straighten your arms to raise the top half of your body and lift your shoulders. Do not use your back muscles. To make yourself more comfortable, you may change where you place your hands. 4. Stay in this position for 5 seconds. 5. Slowly return to lying flat on the floor. Bridges Do these steps 10 times in a row: 1. Lie on your back on a firm surface. 2. Bend your knees so they point up to the ceiling. Your feet should be flat on the floor. 3. Tighten your butt muscles and lift your butt off of the floor until your waist is almost as high as your knees. If you do not feel the muscles working in your butt and the back of your thighs, slide your feet 1-2 inches farther away from your butt. 4. Stay in this position for 3-5 seconds. 5. Slowly lower your butt to the floor, and let your butt muscles relax. If this exercise is too easy, try doing it with your arms crossed over your chest. Belly Crunches Do these steps 5-10 times in a row: 1. Lie on your back on a firm bed  or the floor with your legs stretched out. 2. Bend your knees so they point up to the ceiling. Your feet should be flat on the floor. 3. Cross your arms over your chest. 4. Tip your chin a Guillet bit toward your chest but do not bend your neck. 5. Tighten your belly muscles and slowly raise your chest just enough to lift your shoulder blades a tiny bit off of the floor. 6. Slowly lower your chest and your head to the floor. Back Lifts Do these steps 5-10 times in a row: 1. Lie on your belly (face-down) with your arms at your sides, and rest your forehead on the floor. 2. Tighten the muscles in your legs and your butt. 3. Slowly lift your chest off of the floor while you keep your hips on the floor. Keep the back of your head in line with the curve in your back. Look at the floor while you do this. 4. Stay  in this position for 3-5 seconds. 5. Slowly lower your chest and your face to the floor. GET HELP IF:  Your back pain gets a lot worse when you do an exercise.  Your back pain does not lessen 2 hours after you exercise. If you have any of these problems, stop doing the exercises. Do not do them again unless your doctor says it is okay. GET HELP RIGHT AWAY IF:  You have sudden, very bad back pain. If this happens, stop doing the exercises. Do not do them again unless your doctor says it is okay.   This information is not intended to replace advice given to you by your health care provider. Make sure you discuss any questions you have with your health care provider.   Document Released: 12/29/2010 Document Revised: 08/17/2015 Document Reviewed: 01/20/2015 Elsevier Interactive Patient Education 2016 [employer]  Sciatica Sciatica is pain, weakness, numbness, or tingling along your sciatic nerve. The nerve starts in the lower back and runs down the back of each leg. Nerve damage or certain conditions pinch or put pressure on the sciatic nerve. This causes the pain, weakness, and other discomforts of sciatica. HOME CARE   Only take medicine as told by your doctor.  Apply ice to the affected area for 20 minutes. Do this 3-4 times a day for the first 48-72 hours. Then try heat in the same way.  Exercise, stretch, or do your usual activities if these do not make your pain worse.  Go to physical therapy as told by your doctor.  Keep all doctor visits as told.  Do not wear high heels or shoes that are not supportive.  Get a firm mattress if your mattress is too soft to lessen pain and discomfort. GET HELP RIGHT AWAY IF:   You cannot control when you poop (bowel movement) or pee (urinate).  You have more weakness in your lower back, lower belly (pelvis), butt (buttocks), or legs.  You have redness or puffiness (swelling) of your back.  You have a burning feeling when you pee.  You  have pain that gets worse when you lie down.  You have pain that wakes you from your sleep.  Your pain is worse than past pain.  Your pain lasts longer than 4 weeks.  You are suddenly losing weight without reason. MAKE SURE YOU:   Understand these instructions.  Will watch this condition.  Will get help right away if you are not doing well or get worse.   This information is not  intended to replace advice given to you by your health care provider. Make sure you discuss any questions you have with your health care provider.   Document Released: 09/04/2008 Document Revised: 08/17/2015 Document Reviewed: 04/06/2012 Elsevier Interactive Patient Education [employer].

## 2016-05-28 NOTE — ED Notes (Signed)
See triage note   States she developed pain to left lower back which radiates into left leg for couple of days denies any injury

## 2016-05-28 NOTE — ED Provider Notes (Signed)
[facility] Emergency Department Provider Note  ____________________________________________  Time seen: Approximately 7:21 AM  I have reviewed the triage vital signs and the nursing notes.   HISTORY  Chief Complaint Back Pain    HPI Katherine Gates is a 82 y.o. female, NAD, presents to the emergency department with complaint of left lower back pain that began two days ago. Pain begins about the left upper buttock and radiates down into the lower leg. States she has a history of spinal stenosis and has had some similar symptoms in the past but pain is normally bilateral. She denies saddle paresthesias nor loss of bowel or bladder. Denies any pain about the right lower extremity. Attempted to control pain unsuccessfully with Tylenol. Does have a prescription for Norco at home but states she did not want to use it in conjunction with her Tylenol and notes that she does not care to take the [place]. Due to pain she has been unable to sleep the last two nights and reports pain with movement.  Denies any falls, traumas, injuries. Has not noted any rash, redness, swelling to the back or lower extremities.   Past Medical History  Diagnosis Date  . Dermatitis contact, eyelid   . Rosacea   . Bell's palsy   . Melanoma of skin ([place]) 2011    left upper arm  . Osteoarthritis   . Fibrocystic breast disease     being followed at [facility]   . IBS (irritable bowel syndrome)   . Environmental allergies   . GERD (gastroesophageal reflux disease)     Patient Active Problem List   Diagnosis Date Noted  . Breast cancer, right ([place]) 03/14/2016  . Allergy to environmental factors 01/11/2016  . Bloodgood disease 01/11/2016  . 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 Frequent UTI 01/11/2016  . Nonspecific vaginitis 01/11/2016  . 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 Bell palsy 11/10/2013  . H/O hysterectomy for benign disease 11/10/2013  . Malignant melanoma of skin ([place]) 11/10/2013  .  Arthritis, degenerative 11/10/2013  . Acne erythematosa 11/10/2013    Past Surgical History  Procedure Laterality Date  . Eyelid lift    . Repair tear ducts    . Endoscopic carpal tunnel release Right   . Abdominal hysterectomy    . Partial mastectomy with needle localization Right 03/16/2016    Procedure: PARTIAL MASTECTOMY;  Surgeon: Leonie Green, MD;  Location: [facility] ORS;  Service: General;  Laterality: Right;  . Sentinel node biopsy Right 03/16/2016    Procedure: SENTINEL LYMPH NODE BIOPSY;  Surgeon: Leonie Green, MD;  Location: [facility] ORS;  Service: General;  Laterality: Right;    Current Outpatient Rx  Name  Route  Sig  Dispense  Refill  . acetaminophen (TYLENOL) 500 MG tablet   Oral   Take 500 mg by mouth every 6 (six) hours as needed.         Marland Kitchen aspirin EC 81 MG tablet   Oral   Take by mouth.         . Biotin 5000 MCG TABS   Oral   Take 1 tablet by mouth daily.         . Cholecalciferol (VITAMIN D3) 1000 units CAPS   Oral   Take by mouth daily.         . fexofenadine (ALLEGRA) 180 MG tablet   Oral   Take by mouth.         . fluticasone (FLONASE) 50 MCG/ACT nasal spray  Nasal   Place 1 spray into the nose daily.          Marland Kitchen gentamicin ointment (GARAMYCIN) 0.1 %               . HYDROcodone-acetaminophen (NORCO) 5-325 MG tablet   Oral   Take 1-2 tablets by mouth every 4 (four) hours as needed for moderate pain.   12 tablet   0   . hyoscyamine (LEVBID) 0.375 MG 12 hr tablet   Oral   Take by mouth.         . metroNIDAZOLE (METROGEL) 0.75 % gel   Topical   Apply 1 application topically 2 (two) times daily.          . naproxen sodium (ANAPROX) 220 MG tablet   Oral   Take 220 mg by mouth 2 (two) times daily with a meal.         . Optical Supplies (OCUSOFT DRY EYE) KIT   Does not apply   by Does not apply route.         Marland Kitchen Propylene Glycol (SYSTANE BALANCE OP)   Ophthalmic   Apply 1 drop to eye 2 (two) times daily.          . RABEprazole (ACIPHEX) 20 MG tablet   Oral   Take 20 mg by mouth 2 (two) times daily.            Allergies Benzalkonium chloride; Garlic; and Methylisothiazolinone  Family History  Problem Relation Age of Onset  . Breast cancer Neg Hx   . Skin cancer Brother     non-melanoma  . Skin cancer Father     non-melanoma  . Throat cancer Father   . Stroke Mother     Social History Social History  Substance Use Topics  . Smoking status: Never Smoker   . Smokeless tobacco: None  . Alcohol Use: No     Review of Systems  Constitutional: No fever/chills, fatigue Cardiovascular: No chest pain. Respiratory:  No shortness of breath. No wheezing.  Gastrointestinal: Positive nausea. No abdominal pain.  No vomiting.  No diarrhea.  No constipation. Genitourinary: Negative for dysuria, hematuria. No urinary hesitancy, urgency or increased frequency. Musculoskeletal: Positive for back pain.  Skin: Negative for rash. Neurological: Negative for headaches, focal weakness or numbness. No saddle paresthesias, loss of bowel or bladder control, tingling. 10-point ROS otherwise negative.  ____________________________________________   PHYSICAL EXAM:  VITAL SIGNS: ED Triage Vitals  Enc Vitals Group     BP 05/28/16 0600 156/76 mmHg     Pulse Rate 05/28/16 0600 74     Resp 05/28/16 0600 16     Temp 05/28/16 0600 98 F (36.7 C)     Temp Source 05/28/16 0600 Oral     SpO2 05/28/16 0600 100 %     Weight 05/28/16 0600 154 lb (69.854 kg)     Height 05/28/16 0600 _0  (1.575 m)     Head Cir --      Peak Flow --      Pain Score 05/28/16 0600 10     Pain Loc --      Pain Edu? --      Excl. in [place]? --      Constitutional: Alert and oriented. Well appearing and in no acute distress. Eyes: Conjunctivae are normal.  Head: Atraumatic. Cardiovascular: Normal rate, regular rhythm. Normal S1 and S2.  Good peripheral circulation with 2+ pulses noted in bilateral lower  extremities. Respiratory: Normal respiratory effort  without tachypnea or retractions. Lungs CTAB with breath sounds noted in all lung fields. Musculoskeletal: No lower extremity tenderness nor edema.  No joint effusions. Tenderness to palpation over the left SI joint. Pain with extension of left leg, strength 5/5 bilaterally. Positive straight leg raise on the left. Neurologic:  Normal speech and language. No gross focal neurologic deficits are appreciated. Sensation to light touch grossly in tact about the lower extremities.  Skin:  Skin is warm, dry and intact. No rash noted. Psychiatric: Mood and affect are normal. Speech and behavior are normal. Patient exhibits appropriate insight and judgement.   ____________________________________________   LABS  None ____________________________________________  EKG  None ____________________________________________  RADIOLOGY  None ____________________________________________    PROCEDURES  Procedure(s) performed: None      Medications  ibuprofen (ADVIL,MOTRIN) tablet 400 mg (400 mg Oral Given 05/28/16 0340)  methylPREDNISolone sodium succinate (SOLU-MEDROL) 125 mg/2 mL injection 80 mg (80 mg Intramuscular Given 05/28/16 0733)  ondansetron (ZOFRAN-ODT) disintegrating tablet 4 mg (4 mg Oral Given 05/28/16 0745)     ____________________________________________   INITIAL IMPRESSION / ASSESSMENT AND PLAN / ED COURSE  Patient's diagnosis is consistent with sciatica. Patient will be discharged home with instructions to take the Norco she has at home as needed for pain and may continue Aleve twice daily. Light ROM and stretching as discussed. Patient is to follow up with her PCP if symptoms persist past this treatment course. Patient is given ED precautions to return to the ED for any worsening or new symptoms.    ____________________________________________  FINAL CLINICAL IMPRESSION(S) / ED DIAGNOSES  Final diagnoses:   Sciatica of left side      NEW MEDICATIONS STARTED DURING THIS VISIT:  New Prescriptions   No medications on file         Braxton Feathers, PA-C 05/28/16 [place], MD 05/28/16 1539

## 2016-05-29 ENCOUNTER — Ambulatory Visit
Admission: RE | Admit: 2016-05-29 | Discharge: 2016-05-29 | Disposition: A | Discharge status: Home / Self Care | Payer: Medicare Other | Source: Ambulatory Visit | Attending: Radiation Oncology | Admitting: Radiation Oncology

## 2016-05-29 DIAGNOSIS — C50111 Malignant neoplasm of central portion of right female breast: Secondary | ICD-10-CM | POA: Diagnosis not present

## 2016-05-30 ENCOUNTER — Ambulatory Visit: Payer: Medicare Other

## 2016-05-31 ENCOUNTER — Ambulatory Visit
Admission: RE | Admit: 2016-05-31 | Discharge: 2016-05-31 | Disposition: A | Discharge status: Home / Self Care | Payer: Medicare Other | Source: Ambulatory Visit | Attending: Radiation Oncology | Admitting: Radiation Oncology

## 2016-05-31 ENCOUNTER — Ambulatory Visit: Payer: Medicare Other

## 2016-05-31 DIAGNOSIS — C50111 Malignant neoplasm of central portion of right female breast: Secondary | ICD-10-CM | POA: Diagnosis not present

## 2016-06-01 ENCOUNTER — Ambulatory Visit: Payer: Medicare Other

## 2016-06-01 ENCOUNTER — Ambulatory Visit
Admission: RE | Admit: 2016-06-01 | Discharge: 2016-06-01 | Disposition: A | Discharge status: Home / Self Care | Payer: Medicare Other | Source: Ambulatory Visit | Attending: Radiation Oncology | Admitting: Radiation Oncology

## 2016-06-01 DIAGNOSIS — C50111 Malignant neoplasm of central portion of right female breast: Secondary | ICD-10-CM | POA: Diagnosis not present

## 2016-06-04 ENCOUNTER — Ambulatory Visit
Admission: RE | Admit: 2016-06-04 | Discharge: 2016-06-04 | Disposition: A | Discharge status: Home / Self Care | Payer: Medicare Other | Source: Ambulatory Visit | Attending: Radiation Oncology | Admitting: Radiation Oncology

## 2016-06-04 ENCOUNTER — Ambulatory Visit: Payer: Medicare Other

## 2016-06-04 DIAGNOSIS — C50111 Malignant neoplasm of central portion of right female breast: Secondary | ICD-10-CM | POA: Diagnosis not present

## 2016-06-05 ENCOUNTER — Ambulatory Visit: Payer: Medicare Other

## 2016-06-05 ENCOUNTER — Ambulatory Visit
Admission: RE | Admit: 2016-06-05 | Discharge: 2016-06-05 | Disposition: A | Discharge status: Home / Self Care | Payer: Medicare Other | Source: Ambulatory Visit | Attending: Radiation Oncology | Admitting: Radiation Oncology

## 2016-06-05 ENCOUNTER — Other Ambulatory Visit: Payer: Self-pay | Admitting: *Deleted

## 2016-06-05 DIAGNOSIS — C50111 Malignant neoplasm of central portion of right female breast: Secondary | ICD-10-CM | POA: Diagnosis not present

## 2016-06-05 MED ORDER — AZITHROMYCIN 250 MG PO TABS: ORAL_TABLET | ORAL | Status: DC

## 2016-06-06 ENCOUNTER — Ambulatory Visit
Admission: RE | Admit: 2016-06-06 | Discharge: 2016-06-06 | Disposition: A | Discharge status: Home / Self Care | Payer: Medicare Other | Source: Ambulatory Visit | Attending: Radiation Oncology | Admitting: Radiation Oncology

## 2016-06-06 ENCOUNTER — Ambulatory Visit: Payer: Medicare Other

## 2016-06-06 DIAGNOSIS — C50111 Malignant neoplasm of central portion of right female breast: Secondary | ICD-10-CM | POA: Diagnosis not present

## 2016-06-07 ENCOUNTER — Ambulatory Visit: Payer: Medicare Other

## 2016-06-07 ENCOUNTER — Ambulatory Visit
Admission: RE | Admit: 2016-06-07 | Discharge: 2016-06-07 | Disposition: A | Discharge status: Home / Self Care | Payer: Medicare Other | Source: Ambulatory Visit | Attending: Radiation Oncology | Admitting: Radiation Oncology

## 2016-06-07 DIAGNOSIS — C50111 Malignant neoplasm of central portion of right female breast: Secondary | ICD-10-CM | POA: Diagnosis not present

## 2016-06-08 ENCOUNTER — Ambulatory Visit: Payer: Medicare Other

## 2016-06-08 ENCOUNTER — Ambulatory Visit
Admission: RE | Admit: 2016-06-08 | Discharge: 2016-06-08 | Disposition: A | Discharge status: Home / Self Care | Payer: Medicare Other | Source: Ambulatory Visit | Attending: Radiation Oncology | Admitting: Radiation Oncology

## 2016-06-08 DIAGNOSIS — C50111 Malignant neoplasm of central portion of right female breast: Secondary | ICD-10-CM | POA: Diagnosis not present

## 2016-06-11 ENCOUNTER — Ambulatory Visit: Payer: Medicare Other

## 2016-06-11 ENCOUNTER — Ambulatory Visit
Admission: RE | Admit: 2016-06-11 | Discharge: 2016-06-11 | Disposition: A | Discharge status: Home / Self Care | Payer: Medicare Other | Source: Ambulatory Visit | Attending: Radiation Oncology | Admitting: Radiation Oncology

## 2016-06-11 DIAGNOSIS — C50111 Malignant neoplasm of central portion of right female breast: Secondary | ICD-10-CM | POA: Diagnosis not present

## 2016-06-13 ENCOUNTER — Inpatient Hospital Stay: Payer: Medicare Other | Attending: Oncology

## 2016-06-13 ENCOUNTER — Ambulatory Visit: Payer: Medicare Other

## 2016-06-13 ENCOUNTER — Ambulatory Visit
Admission: RE | Admit: 2016-06-13 | Discharge: 2016-06-13 | Disposition: A | Discharge status: Home / Self Care | Payer: Medicare Other | Source: Ambulatory Visit | Attending: Radiation Oncology | Admitting: Radiation Oncology

## 2016-06-13 DIAGNOSIS — C50911 Malignant neoplasm of unspecified site of right female breast: Secondary | ICD-10-CM | POA: Diagnosis present

## 2016-06-13 DIAGNOSIS — Z170 Estrogen receptor positive status [ER+]: Secondary | ICD-10-CM | POA: Insufficient documentation

## 2016-06-13 DIAGNOSIS — C50111 Malignant neoplasm of central portion of right female breast: Secondary | ICD-10-CM | POA: Diagnosis not present

## 2016-06-13 DIAGNOSIS — C50011 Malignant neoplasm of nipple and areola, right female breast: Secondary | ICD-10-CM

## 2016-06-13 LAB — CBC
HEMATOCRIT: 44 % (ref 35.0–47.0)
HEMOGLOBIN: 14.9 g/dL (ref 12.0–16.0)
MCH: 31.7 pg (ref 26.0–34.0)
MCHC: 33.9 g/dL (ref 32.0–36.0)
MCV: 93.5 fL (ref 80.0–100.0)
PLATELETS: 276 10*3/uL (ref 150–440)
RBC: 4.71 MIL/uL (ref 3.80–5.20)
RDW: 13.5 % (ref 11.5–14.5)
WBC: 16 10*3/uL — AB (ref 3.6–11.0)

## 2016-06-14 ENCOUNTER — Ambulatory Visit: Payer: Medicare Other

## 2016-06-14 ENCOUNTER — Ambulatory Visit
Admission: RE | Admit: 2016-06-14 | Discharge: 2016-06-14 | Disposition: A | Discharge status: Home / Self Care | Payer: Medicare Other | Source: Ambulatory Visit | Attending: Radiation Oncology | Admitting: Radiation Oncology

## 2016-06-14 DIAGNOSIS — C50111 Malignant neoplasm of central portion of right female breast: Secondary | ICD-10-CM | POA: Diagnosis not present

## 2016-06-15 ENCOUNTER — Ambulatory Visit
Admission: RE | Admit: 2016-06-15 | Discharge: 2016-06-15 | Disposition: A | Discharge status: Home / Self Care | Payer: Medicare Other | Source: Ambulatory Visit | Attending: Radiation Oncology | Admitting: Radiation Oncology

## 2016-06-15 ENCOUNTER — Ambulatory Visit: Payer: Medicare Other

## 2016-06-15 DIAGNOSIS — C50111 Malignant neoplasm of central portion of right female breast: Secondary | ICD-10-CM | POA: Diagnosis not present

## 2016-06-18 ENCOUNTER — Ambulatory Visit
Admission: RE | Admit: 2016-06-18 | Discharge: 2016-06-18 | Disposition: A | Discharge status: Home / Self Care | Payer: Medicare Other | Source: Ambulatory Visit | Attending: Radiation Oncology | Admitting: Radiation Oncology

## 2016-06-18 ENCOUNTER — Ambulatory Visit: Payer: Medicare Other

## 2016-06-18 DIAGNOSIS — C50111 Malignant neoplasm of central portion of right female breast: Secondary | ICD-10-CM | POA: Diagnosis not present

## 2016-06-19 ENCOUNTER — Ambulatory Visit: Payer: Medicare Other

## 2016-06-19 ENCOUNTER — Ambulatory Visit
Admission: RE | Admit: 2016-06-19 | Discharge: 2016-06-19 | Disposition: A | Discharge status: Home / Self Care | Payer: Medicare Other | Source: Ambulatory Visit | Attending: Radiation Oncology | Admitting: Radiation Oncology

## 2016-06-19 DIAGNOSIS — C50111 Malignant neoplasm of central portion of right female breast: Secondary | ICD-10-CM | POA: Diagnosis not present

## 2016-06-20 ENCOUNTER — Ambulatory Visit
Admission: RE | Admit: 2016-06-20 | Discharge: 2016-06-20 | Disposition: A | Discharge status: Home / Self Care | Payer: Medicare Other | Source: Ambulatory Visit | Attending: Radiation Oncology | Admitting: Radiation Oncology

## 2016-06-20 ENCOUNTER — Ambulatory Visit: Payer: Medicare Other

## 2016-06-20 DIAGNOSIS — C50111 Malignant neoplasm of central portion of right female breast: Secondary | ICD-10-CM | POA: Diagnosis not present

## 2016-06-21 ENCOUNTER — Ambulatory Visit: Payer: Medicare Other

## 2016-06-21 ENCOUNTER — Ambulatory Visit
Admission: RE | Admit: 2016-06-21 | Discharge: 2016-06-21 | Disposition: A | Discharge status: Home / Self Care | Payer: Medicare Other | Source: Ambulatory Visit | Attending: Radiation Oncology | Admitting: Radiation Oncology

## 2016-06-21 DIAGNOSIS — C50111 Malignant neoplasm of central portion of right female breast: Secondary | ICD-10-CM | POA: Diagnosis not present

## 2016-06-22 ENCOUNTER — Ambulatory Visit: Payer: Medicare Other

## 2016-06-22 ENCOUNTER — Ambulatory Visit
Admission: RE | Admit: 2016-06-22 | Discharge: 2016-06-22 | Disposition: A | Discharge status: Home / Self Care | Payer: Medicare Other | Source: Ambulatory Visit | Attending: Radiation Oncology | Admitting: Radiation Oncology

## 2016-06-22 DIAGNOSIS — C50111 Malignant neoplasm of central portion of right female breast: Secondary | ICD-10-CM | POA: Diagnosis not present

## 2016-06-25 ENCOUNTER — Ambulatory Visit: Payer: Medicare Other

## 2016-06-25 ENCOUNTER — Ambulatory Visit
Admission: RE | Admit: 2016-06-25 | Discharge: 2016-06-25 | Disposition: A | Discharge status: Home / Self Care | Payer: Medicare Other | Source: Ambulatory Visit | Attending: Radiation Oncology | Admitting: Radiation Oncology

## 2016-06-25 DIAGNOSIS — C50111 Malignant neoplasm of central portion of right female breast: Secondary | ICD-10-CM | POA: Diagnosis not present

## 2016-06-26 ENCOUNTER — Ambulatory Visit
Admission: RE | Admit: 2016-06-26 | Discharge: 2016-06-26 | Disposition: A | Discharge status: Home / Self Care | Payer: Medicare Other | Source: Ambulatory Visit | Attending: Radiation Oncology | Admitting: Radiation Oncology

## 2016-06-26 ENCOUNTER — Ambulatory Visit: Payer: Medicare Other

## 2016-06-26 DIAGNOSIS — C50111 Malignant neoplasm of central portion of right female breast: Secondary | ICD-10-CM | POA: Diagnosis not present

## 2016-06-27 ENCOUNTER — Ambulatory Visit
Admission: RE | Admit: 2016-06-27 | Discharge: 2016-06-27 | Disposition: A | Discharge status: Home / Self Care | Payer: Medicare Other | Source: Ambulatory Visit | Attending: Radiation Oncology | Admitting: Radiation Oncology

## 2016-06-27 ENCOUNTER — Ambulatory Visit: Payer: Medicare Other

## 2016-06-27 ENCOUNTER — Inpatient Hospital Stay: Payer: Medicare Other

## 2016-06-27 DIAGNOSIS — C50911 Malignant neoplasm of unspecified site of right female breast: Secondary | ICD-10-CM | POA: Diagnosis not present

## 2016-06-27 DIAGNOSIS — C50011 Malignant neoplasm of nipple and areola, right female breast: Secondary | ICD-10-CM

## 2016-06-27 DIAGNOSIS — C50111 Malignant neoplasm of central portion of right female breast: Secondary | ICD-10-CM | POA: Diagnosis not present

## 2016-06-27 LAB — CBC
HEMATOCRIT: 41.7 % (ref 35.0–47.0)
Hemoglobin: 14.5 g/dL (ref 12.0–16.0)
MCH: 32.4 pg (ref 26.0–34.0)
MCHC: 34.8 g/dL (ref 32.0–36.0)
MCV: 93 fL (ref 80.0–100.0)
Platelets: 199 10*3/uL (ref 150–440)
RBC: 4.48 MIL/uL (ref 3.80–5.20)
RDW: 13.7 % (ref 11.5–14.5)
WBC: 6.5 10*3/uL (ref 3.6–11.0)

## 2016-06-28 ENCOUNTER — Ambulatory Visit: Payer: Medicare Other

## 2016-06-28 ENCOUNTER — Ambulatory Visit
Admission: RE | Admit: 2016-06-28 | Discharge: 2016-06-28 | Disposition: A | Discharge status: Home / Self Care | Payer: Medicare Other | Source: Ambulatory Visit | Attending: Radiation Oncology | Admitting: Radiation Oncology

## 2016-06-28 DIAGNOSIS — C50111 Malignant neoplasm of central portion of right female breast: Secondary | ICD-10-CM | POA: Diagnosis not present

## 2016-06-29 ENCOUNTER — Ambulatory Visit: Payer: Medicare Other

## 2016-06-29 ENCOUNTER — Ambulatory Visit
Admission: RE | Admit: 2016-06-29 | Discharge: 2016-06-29 | Disposition: A | Discharge status: Home / Self Care | Payer: Medicare Other | Source: Ambulatory Visit | Attending: Radiation Oncology | Admitting: Radiation Oncology

## 2016-06-29 DIAGNOSIS — C50111 Malignant neoplasm of central portion of right female breast: Secondary | ICD-10-CM | POA: Diagnosis not present

## 2016-07-02 ENCOUNTER — Ambulatory Visit
Admission: RE | Admit: 2016-07-02 | Discharge: 2016-07-02 | Disposition: A | Discharge status: Home / Self Care | Payer: Medicare Other | Source: Ambulatory Visit | Attending: Radiation Oncology | Admitting: Radiation Oncology

## 2016-07-02 ENCOUNTER — Ambulatory Visit: Payer: Medicare Other

## 2016-07-02 DIAGNOSIS — C50111 Malignant neoplasm of central portion of right female breast: Secondary | ICD-10-CM | POA: Diagnosis not present

## 2016-07-03 ENCOUNTER — Ambulatory Visit
Admission: RE | Admit: 2016-07-03 | Discharge: 2016-07-03 | Disposition: A | Discharge status: Home / Self Care | Payer: Medicare Other | Source: Ambulatory Visit | Attending: Radiation Oncology | Admitting: Radiation Oncology

## 2016-07-03 ENCOUNTER — Ambulatory Visit: Payer: Medicare Other

## 2016-07-03 DIAGNOSIS — C50111 Malignant neoplasm of central portion of right female breast: Secondary | ICD-10-CM | POA: Diagnosis not present

## 2016-07-04 ENCOUNTER — Ambulatory Visit: Payer: Medicare Other

## 2016-07-04 ENCOUNTER — Ambulatory Visit
Admission: RE | Admit: 2016-07-04 | Discharge: 2016-07-04 | Disposition: A | Discharge status: Home / Self Care | Payer: Medicare Other | Source: Ambulatory Visit | Attending: Radiation Oncology | Admitting: Radiation Oncology

## 2016-07-04 DIAGNOSIS — C50111 Malignant neoplasm of central portion of right female breast: Secondary | ICD-10-CM | POA: Diagnosis not present

## 2016-07-05 ENCOUNTER — Ambulatory Visit: Payer: Medicare Other

## 2016-07-05 ENCOUNTER — Ambulatory Visit
Admission: RE | Admit: 2016-07-05 | Discharge: 2016-07-05 | Disposition: A | Discharge status: Home / Self Care | Payer: Medicare Other | Source: Ambulatory Visit | Attending: Radiation Oncology | Admitting: Radiation Oncology

## 2016-07-05 DIAGNOSIS — C50111 Malignant neoplasm of central portion of right female breast: Secondary | ICD-10-CM | POA: Diagnosis not present

## 2016-07-06 ENCOUNTER — Ambulatory Visit
Admission: RE | Admit: 2016-07-06 | Discharge: 2016-07-06 | Disposition: A | Discharge status: Home / Self Care | Payer: Medicare Other | Source: Ambulatory Visit | Attending: Radiation Oncology | Admitting: Radiation Oncology

## 2016-07-06 ENCOUNTER — Ambulatory Visit: Payer: Medicare Other

## 2016-07-06 DIAGNOSIS — C50111 Malignant neoplasm of central portion of right female breast: Secondary | ICD-10-CM | POA: Diagnosis not present

## 2016-07-09 ENCOUNTER — Ambulatory Visit
Admission: RE | Admit: 2016-07-09 | Discharge: 2016-07-09 | Disposition: A | Discharge status: Home / Self Care | Payer: Medicare Other | Source: Ambulatory Visit | Attending: Radiation Oncology | Admitting: Radiation Oncology

## 2016-07-09 ENCOUNTER — Ambulatory Visit: Payer: Medicare Other

## 2016-07-09 DIAGNOSIS — C50111 Malignant neoplasm of central portion of right female breast: Secondary | ICD-10-CM | POA: Diagnosis not present

## 2016-07-09 NOTE — Progress Notes (Signed)
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  INTERVAL HISTORY: Patient returns to clinic today for further evaluation and initiation of letrozole. She is tolerating her XRT well with only some mild skin irritation. She otherwise feels well and is asymptomatic. She has no neurologic complaints. She denies any fevers. She has a good appetite and denies weight loss. She denies any pain. She has no chest pain or shortness of breath. She denies any nausea, vomiting, constipation, or diarrhea. She has no urinary complaints. Patient offers no further specific complaints today.  REVIEW OF SYSTEMS:   Review of Systems  Constitutional: Negative.  Negative for fever, malaise/fatigue and weight loss.  Respiratory: Negative.  Negative for sputum production.   Cardiovascular: Negative.  Negative for chest pain.  Gastrointestinal: Negative.   Genitourinary: Negative.   Skin:       Right breast erythema.  Neurological: Negative.  Negative for weakness.  Psychiatric/Behavioral: The patient is not nervous/anxious.     As per HPI. Otherwise, a complete review of systems is negatve.  PAST MEDICAL HISTORY: Past Medical History:  Diagnosis Date  . Bell's palsy   . Dermatitis contact, eyelid   . Environmental allergies   . Fibrocystic breast disease    being followed at [facility]   . GERD (gastroesophageal reflux disease)   . IBS (irritable bowel syndrome)   . Melanoma of skin ([place]) 2011   left upper arm  . Osteoarthritis   . Rosacea     PAST SURGICAL HISTORY: Past Surgical History:  Procedure Laterality Date  . ABDOMINAL HYSTERECTOMY    . ENDOSCOPIC CARPAL TUNNEL RELEASE Right   . eyelid lift    .  PARTIAL MASTECTOMY WITH NEEDLE LOCALIZATION Right 03/16/2016   Procedure: PARTIAL MASTECTOMY;  Surgeon: Leonie Green, MD;  Location: [facility] ORS;  Service: General;  Laterality: Right;  . repair tear ducts    . SENTINEL NODE BIOPSY Right 03/16/2016   Procedure: SENTINEL LYMPH NODE BIOPSY;  Surgeon: Leonie Green, MD;  Location: [facility] ORS;  Service: General;  Laterality: Right;    FAMILY HISTORY Family History  Problem Relation Age of Onset  . Skin cancer Brother     non-melanoma  . Skin cancer Father     non-melanoma  . Throat cancer Father   . Stroke Mother   . Breast cancer Neg Hx        ADVANCED DIRECTIVES:    HEALTH MAINTENANCE: Social History  Substance Use Topics  . Smoking status: Never Smoker  . Smokeless tobacco: Not on file  . Alcohol use No     Colonoscopy:  PAP:  Bone density:  Lipid panel:  Allergies  Allergen Reactions  . Benzalkonium Chloride Other (See Comments)    Positive patch test  . Garlic Diarrhea  . Methylisothiazolinone Other (See Comments)    Positive patch test Positive patch test    Current Outpatient Prescriptions  Medication Sig Dispense Refill  . aspirin EC 81 MG tablet Take by mouth.    . Biotin 5000 MCG TABS Take 1 tablet by mouth daily.    . Cholecalciferol (VITAMIN D3) 1000 units CAPS Take by mouth daily.    . fexofenadine (ALLEGRA) 180 MG tablet Take by mouth.    . fluticasone (  FLONASE) 50 MCG/ACT nasal spray Place 1 spray into the nose daily.     Marland Kitchen gentamicin ointment (GARAMYCIN) 0.1 %     . HYDROcodone-acetaminophen (NORCO) 5-325 MG tablet Take 1-2 tablets by mouth every 4 (four) hours as needed for moderate pain. 12 tablet 0  . hyoscyamine (LEVBID) 0.375 MG 12 hr tablet Take by mouth.    . metroNIDAZOLE (METROGEL) 0.75 % gel Apply 1 application topically 2 (two) times daily.     . naproxen sodium (ANAPROX) 220 MG tablet Take 220 mg by mouth 2 (two) times daily with a meal.    . Optical Supplies (OCUSOFT DRY EYE) KIT  by Does not apply route.    . RABEprazole (ACIPHEX) 20 MG tablet Take 20 mg by mouth 2 (two) times daily.     Marland Kitchen acetaminophen (TYLENOL) 500 MG tablet Take 500 mg by mouth every 6 (six) hours as needed.    Marland Kitchen letrozole (FEMARA) 2.5 MG tablet Take 1 tablet (2.5 mg total) by mouth daily. 30 tablet 6  . Propylene Glycol (SYSTANE BALANCE OP) Apply 1 drop to eye 2 (two) times daily.     No current facility-administered medications for this visit.     OBJECTIVE: Vitals:   07/10/16 1446  BP: 133/77  Pulse: 73  Resp: 17  Temp: (!) 96 F (35.6 C)     Body mass index is 29.42 kg/m.    ECOG FS:0 - Asymptomatic  General: Well-developed, well-nourished, no acute distress. Eyes: Pink conjunctiva, anicteric sclera. Breasts: Right breast erythema. Lungs: Clear to auscultation bilaterally. Heart: Regular rate and rhythm. No rubs, murmurs, or gallops. Abdomen: Soft, nontender, nondistended. No organomegaly noted, normoactive bowel sounds. Musculoskeletal: No edema, cyanosis, or clubbing. Neuro: Alert, answering all questions appropriately. Cranial nerves grossly intact. Skin: No rashes or petechiae noted. Psych: Normal affect.   LAB RESULTS:  Lab Results  Component Value Date   NA 139 03/08/2016   K 3.9 03/08/2016   CL 105 03/08/2016   CO2 26 03/08/2016   GLUCOSE 104 (H) 03/08/2016   BUN 21 (H) 03/08/2016   CREATININE 0.94 03/08/2016   CALCIUM 9.3 03/08/2016   PROT 7.0 03/08/2016   ALBUMIN 3.8 03/08/2016   AST 59 (H) 03/08/2016   ALT 41 03/08/2016   ALKPHOS 74 03/08/2016   BILITOT 0.5 03/08/2016   GFRNONAA 55 (L) 03/08/2016   GFRAA >60 03/08/2016    Lab Results  Component Value Date   WBC 6.4 07/11/2016   NEUTROABS 5.3 03/08/2016   HGB 13.8 07/11/2016   HCT 39.2 07/11/2016   MCV 92.4 07/11/2016   PLT 223 07/11/2016     STUDIES: No results found.  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  Mammoprint low risk.  PLAN:    1. 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 Continue daily XRT completing on July 19, 2016. Patient was given a prescription for letrozole today which she will initiate at the end of her XRT. She will take this for 5 years completing in August 2022. We will also get a baseline bone mineral density in the next 1-2 weeks. Return to clinic in 3 months for further evaluation.   Approximately 30 minutes was spent in discussion of which greater than 50% was consultation.   Patient expressed understanding and was in agreement with this plan. She also  understands that She can call clinic at any time with any questions, concerns, or complaints.    Lloyd Huger, MD   07/11/2016 10:19 PM

## 2016-07-10 ENCOUNTER — Encounter: Payer: Self-pay | Admitting: Oncology

## 2016-07-10 ENCOUNTER — Inpatient Hospital Stay: Payer: Medicare Other | Attending: Oncology | Admitting: Oncology

## 2016-07-10 ENCOUNTER — Ambulatory Visit
Admission: RE | Admit: 2016-07-10 | Discharge: 2016-07-10 | Disposition: A | Discharge status: Home / Self Care | Payer: Medicare Other | Source: Ambulatory Visit | Attending: Radiation Oncology | Admitting: Radiation Oncology

## 2016-07-10 VITALS — BP 133/77 | HR 73 | Temp 96.0°F | Resp 17 | Ht 62.0 in | Wt 160.8 lb

## 2016-07-10 DIAGNOSIS — Z79899 Other long term (current) drug therapy: Secondary | ICD-10-CM

## 2016-07-10 DIAGNOSIS — C50111 Malignant neoplasm of central portion of right female breast: Secondary | ICD-10-CM | POA: Insufficient documentation

## 2016-07-10 DIAGNOSIS — C50911 Malignant neoplasm of unspecified site of right female breast: Secondary | ICD-10-CM | POA: Diagnosis present

## 2016-07-10 DIAGNOSIS — Z7982 Long term (current) use of aspirin: Secondary | ICD-10-CM | POA: Diagnosis not present

## 2016-07-10 DIAGNOSIS — K219 Gastro-esophageal reflux disease without esophagitis: Secondary | ICD-10-CM | POA: Diagnosis not present

## 2016-07-10 DIAGNOSIS — M1990 Unspecified osteoarthritis, unspecified site: Secondary | ICD-10-CM | POA: Insufficient documentation

## 2016-07-10 DIAGNOSIS — Z79811 Long term (current) use of aromatase inhibitors: Secondary | ICD-10-CM | POA: Diagnosis not present

## 2016-07-10 DIAGNOSIS — Z170 Estrogen receptor positive status [ER+]: Secondary | ICD-10-CM | POA: Diagnosis not present

## 2016-07-10 DIAGNOSIS — K589 Irritable bowel syndrome without diarrhea: Secondary | ICD-10-CM | POA: Insufficient documentation

## 2016-07-10 DIAGNOSIS — N6019 Diffuse cystic mastopathy of unspecified breast: Secondary | ICD-10-CM | POA: Diagnosis not present

## 2016-07-10 MED ORDER — LETROZOLE 2.5 MG PO TABS: 2.5000 mg | ORAL_TABLET | Freq: Every day | ORAL | 6 refills | Status: DC

## 2016-07-10 NOTE — Progress Notes (Signed)
Pt reports not many changes since  Last visit other than breaking out on chest.  Some tenderness under right arm.  Dr. Baruch Gouty aware per pt of the breaking out.

## 2016-07-11 ENCOUNTER — Inpatient Hospital Stay: Payer: Medicare Other

## 2016-07-11 ENCOUNTER — Other Ambulatory Visit: Payer: Self-pay

## 2016-07-11 ENCOUNTER — Ambulatory Visit
Admission: RE | Admit: 2016-07-11 | Discharge: 2016-07-11 | Disposition: A | Discharge status: Home / Self Care | Payer: Medicare Other | Source: Ambulatory Visit | Attending: Radiation Oncology | Admitting: Radiation Oncology

## 2016-07-11 DIAGNOSIS — C50011 Malignant neoplasm of nipple and areola, right female breast: Secondary | ICD-10-CM

## 2016-07-11 DIAGNOSIS — C50111 Malignant neoplasm of central portion of right female breast: Secondary | ICD-10-CM | POA: Diagnosis not present

## 2016-07-11 LAB — CBC
HEMATOCRIT: 39.2 % (ref 35.0–47.0)
Hemoglobin: 13.8 g/dL (ref 12.0–16.0)
MCH: 32.4 pg (ref 26.0–34.0)
MCHC: 35.1 g/dL (ref 32.0–36.0)
MCV: 92.4 fL (ref 80.0–100.0)
Platelets: 223 10*3/uL (ref 150–440)
RBC: 4.24 MIL/uL (ref 3.80–5.20)
RDW: 14.1 % (ref 11.5–14.5)
WBC: 6.4 10*3/uL (ref 3.6–11.0)

## 2016-07-12 ENCOUNTER — Ambulatory Visit
Admission: RE | Admit: 2016-07-12 | Discharge: 2016-07-12 | Disposition: A | Discharge status: Home / Self Care | Payer: Medicare Other | Source: Ambulatory Visit | Attending: Radiation Oncology | Admitting: Radiation Oncology

## 2016-07-12 DIAGNOSIS — C50111 Malignant neoplasm of central portion of right female breast: Secondary | ICD-10-CM | POA: Diagnosis not present

## 2016-07-13 ENCOUNTER — Ambulatory Visit
Admission: RE | Admit: 2016-07-13 | Discharge: 2016-07-13 | Disposition: A | Discharge status: Home / Self Care | Payer: Medicare Other | Source: Ambulatory Visit | Attending: Radiation Oncology | Admitting: Radiation Oncology

## 2016-07-13 DIAGNOSIS — C50111 Malignant neoplasm of central portion of right female breast: Secondary | ICD-10-CM | POA: Diagnosis not present

## 2016-07-16 ENCOUNTER — Ambulatory Visit
Admission: RE | Admit: 2016-07-16 | Discharge: 2016-07-16 | Disposition: A | Discharge status: Home / Self Care | Payer: Medicare Other | Source: Ambulatory Visit | Attending: Radiation Oncology | Admitting: Radiation Oncology

## 2016-07-16 DIAGNOSIS — C50111 Malignant neoplasm of central portion of right female breast: Secondary | ICD-10-CM | POA: Diagnosis not present

## 2016-07-17 ENCOUNTER — Ambulatory Visit
Admission: RE | Admit: 2016-07-17 | Discharge: 2016-07-17 | Disposition: A | Discharge status: Home / Self Care | Payer: Medicare Other | Source: Ambulatory Visit | Attending: Radiation Oncology | Admitting: Radiation Oncology

## 2016-07-17 DIAGNOSIS — C50111 Malignant neoplasm of central portion of right female breast: Secondary | ICD-10-CM | POA: Diagnosis not present

## 2016-07-18 ENCOUNTER — Ambulatory Visit: Payer: Medicare Other

## 2016-07-18 ENCOUNTER — Ambulatory Visit
Admission: RE | Admit: 2016-07-18 | Discharge: 2016-07-18 | Disposition: A | Discharge status: Home / Self Care | Payer: Medicare Other | Source: Ambulatory Visit | Attending: Radiation Oncology | Admitting: Radiation Oncology

## 2016-07-18 DIAGNOSIS — C50111 Malignant neoplasm of central portion of right female breast: Secondary | ICD-10-CM | POA: Diagnosis not present

## 2016-07-19 ENCOUNTER — Ambulatory Visit
Admission: RE | Admit: 2016-07-19 | Discharge: 2016-07-19 | Disposition: A | Discharge status: Home / Self Care | Payer: Medicare Other | Source: Ambulatory Visit | Attending: Radiation Oncology | Admitting: Radiation Oncology

## 2016-07-19 ENCOUNTER — Ambulatory Visit: Payer: Medicare Other

## 2016-07-19 DIAGNOSIS — C50111 Malignant neoplasm of central portion of right female breast: Secondary | ICD-10-CM | POA: Diagnosis not present

## 2016-07-20 ENCOUNTER — Ambulatory Visit: Payer: Medicare Other

## 2016-08-01 ENCOUNTER — Telehealth: Payer: Self-pay | Admitting: *Deleted

## 2016-08-01 NOTE — Telephone Encounter (Signed)
Returned call to patient advised to hold Letrozole times 1 - 2 weeks and call after 2 weeks. She repeated this back to me

## 2016-08-01 NOTE — Telephone Encounter (Signed)
Patient recently started letrozole on July 10, 2016. Her leg pain could be secondary to this. Have patient stop letrozole and call in 1-2 weeks to see if her symptoms improve.

## 2016-08-01 NOTE — Telephone Encounter (Signed)
Called to report that she is having pain in her legs, asking if the letrozole is causing this. Please advise

## 2016-08-02 ENCOUNTER — Ambulatory Visit
Admission: RE | Admit: 2016-08-02 | Discharge: 2016-08-02 | Disposition: A | Discharge status: Home / Self Care | Payer: Medicare Other | Source: Ambulatory Visit | Attending: Oncology | Admitting: Oncology

## 2016-08-02 DIAGNOSIS — C50111 Malignant neoplasm of central portion of right female breast: Secondary | ICD-10-CM

## 2016-08-02 DIAGNOSIS — C50919 Malignant neoplasm of unspecified site of unspecified female breast: Secondary | ICD-10-CM | POA: Insufficient documentation

## 2016-08-02 DIAGNOSIS — Z13820 Encounter for screening for osteoporosis: Secondary | ICD-10-CM | POA: Diagnosis present

## 2016-08-15 ENCOUNTER — Other Ambulatory Visit: Payer: Self-pay | Admitting: Oncology

## 2016-08-15 ENCOUNTER — Telehealth: Payer: Self-pay | Admitting: *Deleted

## 2016-08-15 MED ORDER — ANASTROZOLE 1 MG PO TABS: 1.0000 mg | ORAL_TABLET | Freq: Every day | ORAL | 11 refills | Status: DC

## 2016-08-15 NOTE — Telephone Encounter (Signed)
Discontinue letrozole altogether.  A prescription for anastrozole has been escribed.  Thanks.

## 2016-08-15 NOTE — Telephone Encounter (Signed)
Patient called to notify MD that since stopping her medication two weeks ago she feels better. Her leg is better.

## 2016-08-17 ENCOUNTER — Telehealth: Payer: Self-pay | Admitting: *Deleted

## 2016-08-17 NOTE — Telephone Encounter (Signed)
States she has been off her med for 2 weeks and thought she was to start something new.  States leg pain is better, but not gone. Reports she has had sciatica and has been working on it. States she has good days and bad, but it was definitely worse with the Letrozole. Please advise

## 2016-08-17 NOTE — Telephone Encounter (Signed)
Patient advised. Rx sent to pharmacy. 

## 2016-08-17 NOTE — Telephone Encounter (Signed)
Anastrozole was escribed to [facility] on 08/15/16.

## 2016-08-22 ENCOUNTER — Encounter: Payer: Self-pay | Admitting: Radiation Oncology

## 2016-08-22 ENCOUNTER — Ambulatory Visit
Admission: RE | Admit: 2016-08-22 | Discharge: 2016-08-22 | Disposition: A | Discharge status: Home / Self Care | Payer: Medicare Other | Source: Ambulatory Visit | Attending: Radiation Oncology | Admitting: Radiation Oncology

## 2016-08-22 VITALS — BP 168/78 | HR 74 | Temp 96.2°F | Resp 20 | Wt 162.3 lb

## 2016-08-22 DIAGNOSIS — Z79811 Long term (current) use of aromatase inhibitors: Secondary | ICD-10-CM | POA: Insufficient documentation

## 2016-08-22 DIAGNOSIS — C50011 Malignant neoplasm of nipple and areola, right female breast: Secondary | ICD-10-CM

## 2016-08-22 DIAGNOSIS — C50911 Malignant neoplasm of unspecified site of right female breast: Secondary | ICD-10-CM | POA: Insufficient documentation

## 2016-08-22 DIAGNOSIS — Z170 Estrogen receptor positive status [ER+]: Secondary | ICD-10-CM | POA: Insufficient documentation

## 2016-08-22 DIAGNOSIS — Z923 Personal history of irradiation: Secondary | ICD-10-CM | POA: Insufficient documentation

## 2016-08-22 NOTE — Progress Notes (Signed)
Radiation Oncology Follow up Note  Name: Katherine Gates   Date:   08/22/2016 MRN:  [id number] DOB: August 26, 1933    This 82 y.o. female presents to the clinic today for one-month follow-up status post whole breast radiation to her right breast for stage I ER/PR positive HER-2/neu negative invasive mammary carcinoma status post wide local excision.Marland Kitchen  REFERRING PROVIDER: Idelle Crouch, MD  HPI: Patient is a 82 year old female now out 1 month having completed radiation therapy to her right breast for a T1 CN 0 M0) ER/PR positive HER-2/neu negative invasive mammary carcinoma the right breast status post wide local excision with sacrifice of the nipple areolar complex. She seen today one month out and is doing well. The breast still somewhat tender and erythematous not unusual for this time after whole breast radiation.Marland Kitchen She was tried on letrozole although had significant lower extremity and joint pain and was switched to anastrozole which he is on now without side effect. She specifically denies significant breast tenderness cough or bone pain.  COMPLICATIONS OF TREATMENT: none  FOLLOW UP COMPLIANCE: keeps appointments   PHYSICAL EXAM:  BP (!) 168/78   Pulse 74   Temp (!) 96.2 F (35.7 C)   Resp 20   Wt 162 lb 4.1 oz (73.6 kg)   BMI 29.68 kg/m  Lungs are clear to A&P cardiac examination essentially unremarkable with regular rate and rhythm. No dominant mass or nodularity is noted in either breast in 2 positions examined. Incision is well-healed. No axillary or supraclavicular adenopathy is appreciated. Cosmetic result is excellent. Patient said sacrifice the nipple areolar complex. Skin is still somewhat erythematous slightly tender although cosmetic result is still excellent Well-developed well-nourished patient in NAD. HEENT reveals PERLA, EOMI, discs not visualized.  Oral cavity is clear. No oral mucosal lesions are identified. Neck is clear without evidence of cervical or supraclavicular  adenopathy. Lungs are clear to A&P. Cardiac examination is essentially unremarkable with regular rate and rhythm without murmur rub or thrill. Abdomen is benign with no organomegaly or masses noted. Motor sensory and DTR levels are equal and symmetric in the upper and lower extremities. Cranial nerves II through XII are grossly intact. Proprioception is intact. No peripheral adenopathy or edema is identified. No motor or sensory levels are noted. Crude visual fields are within normal range.  RADIOLOGY RESULTS: No current films for review  PLAN: At the present time she is doing well I've assured her the breast will continue to heal and clear nicely over the next several weeks. She currently is on anti-estrogen therapy and tolerating that well. I have asked to see her back in 4-5 months for follow-up. She knows to call sooner with any concerns.  I would like to take this opportunity to thank you for allowing me to participate in the care of your patient.Armstead Peaks., MD

## 2016-09-04 DIAGNOSIS — E782 Mixed hyperlipidemia: Secondary | ICD-10-CM | POA: Insufficient documentation

## 2016-10-10 NOTE — Progress Notes (Signed)
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  INTERVAL HISTORY: Patient returns to clinic today for routine 3 month evaluation. She continues to have right leg pain consistent with her history of sciatica as well as left arm pain. She also complains of hot flashes and difficulty sleeping. She has no other neurologic complaints. She denies any fevers. She has a good appetite and denies weight loss. She denies any pain. She has no chest pain or shortness of breath. She denies any nausea, vomiting, constipation, or diarrhea. She has no urinary complaints. Patient offers no further specific complaints today.  REVIEW OF SYSTEMS:   Review of Systems  Constitutional: Negative.  Negative for fever, malaise/fatigue and weight loss.  Respiratory: Negative.  Negative for sputum production.   Cardiovascular: Negative.  Negative for chest pain and leg swelling.  Gastrointestinal: Negative.  Negative for abdominal pain.  Genitourinary: Negative.   Musculoskeletal: Positive for joint pain.  Skin:       Right breast erythema.  Neurological: Positive for sensory change. Negative for weakness.  Psychiatric/Behavioral: The patient has insomnia. The patient is not nervous/anxious.     As per HPI. Otherwise, a complete review of systems is negative.  PAST MEDICAL HISTORY: Past Medical History:  Diagnosis Date  . Bell's palsy   . Dermatitis contact, eyelid   . Environmental allergies   . Fibrocystic breast disease    being followed at [facility]   . GERD (gastroesophageal reflux disease)   . IBS (irritable bowel syndrome)   . Melanoma of skin ([place]) 2011   left upper arm  . Osteoarthritis   . Rosacea      PAST SURGICAL HISTORY: Past Surgical History:  Procedure Laterality Date  . ABDOMINAL HYSTERECTOMY    . ENDOSCOPIC CARPAL TUNNEL RELEASE Right   . eyelid lift    . PARTIAL MASTECTOMY WITH NEEDLE LOCALIZATION Right 03/16/2016   Procedure: PARTIAL MASTECTOMY;  Surgeon: Leonie Green, MD;  Location: [facility] ORS;  Service: General;  Laterality: Right;  . repair tear ducts    . SENTINEL NODE BIOPSY Right 03/16/2016   Procedure: SENTINEL LYMPH NODE BIOPSY;  Surgeon: Leonie Green, MD;  Location: [facility] ORS;  Service: General;  Laterality: Right;    FAMILY HISTORY Family History  Problem Relation Age of Onset  . Skin cancer Brother     non-melanoma  . Skin cancer Father     non-melanoma  . Throat cancer Father   . Stroke Mother   . Breast cancer Neg Hx        ADVANCED DIRECTIVES:    HEALTH MAINTENANCE: Social History  Substance Use Topics  . Smoking status: Never Smoker  . Smokeless tobacco: Not on file  . Alcohol use No     Colonoscopy:  PAP:  Bone density:  Lipid panel:  Allergies  Allergen Reactions  . Benzalkonium Chloride Other (See Comments)    Positive patch test  . Garlic Diarrhea  . Methylisothiazolinone Other (See Comments)    Positive patch test Positive patch test    Current Outpatient Prescriptions  Medication Sig Dispense Refill  . acetaminophen (TYLENOL) 500 MG tablet Take 500 mg by mouth every 6 (six) hours as needed.    Marland Kitchen anastrozole (ARIMIDEX) 1 MG tablet Take  1 tablet (1 mg total) by mouth daily. 30 tablet 11  . aspirin EC 81 MG tablet Take 81 mg by mouth daily.     . Biotin 5000 MCG TABS Take 1 tablet by mouth daily.    . Cholecalciferol (VITAMIN D3) 1000 units CAPS Take by mouth daily.    . fexofenadine (ALLEGRA) 180 MG tablet Take by mouth.    . fluticasone (FLONASE) 50 MCG/ACT nasal spray Place 1 spray into the nose daily.     . gentamicin ointment (GARAMYCIN) 0.1 %     . HYDROcodone-acetaminophen (NORCO) 5-325 MG tablet Take 1-2  tablets by mouth every 4 (four) hours as needed for moderate pain. 12 tablet 0  . hyoscyamine (LEVBID) 0.375 MG 12 hr tablet Take by mouth.    . letrozole (FEMARA) 2.5 MG tablet     . metroNIDAZOLE (METROGEL) 0.75 % gel Apply 1 application topically 2 (two) times daily.     . naproxen sodium (ANAPROX) 220 MG tablet Take 220 mg by mouth 2 (two) times daily with a meal.    . Optical Supplies (OCUSOFT DRY EYE) KIT by Does not apply route.    . Propylene Glycol (SYSTANE BALANCE OP) Apply 1 drop to eye 2 (two) times daily.    . RABEprazole (ACIPHEX) 20 MG tablet Take 20 mg by mouth 2 (two) times daily.      No current facility-administered medications for this visit.     OBJECTIVE: Vitals:   10/11/16 1458  BP: (!) 146/75  Pulse: 73  Resp: 18  Temp: 97.3 F (36.3 C)     Body mass index is 29.8 kg/m.    ECOG FS:0 - Asymptomatic  General: Well-developed, well-nourished, no acute distress. Eyes: Pink conjunctiva, anicteric sclera. Breasts: Patient requested exam be deferred today. Lungs: Clear to auscultation bilaterally. Heart: Regular rate and rhythm. No rubs, murmurs, or gallops. Abdomen: Soft, nontender, nondistended. No organomegaly noted, normoactive bowel sounds. Musculoskeletal: No edema, cyanosis, or clubbing. Neuro: Alert, answering all questions appropriately. Cranial nerves grossly intact. Skin: No rashes or petechiae noted. Psych: Normal affect.   LAB RESULTS:  Lab Results  Component Value Date   NA 139 03/08/2016   K 3.9 03/08/2016   CL 105 03/08/2016   CO2 26 03/08/2016   GLUCOSE 104 (H) 03/08/2016   BUN 21 (H) 03/08/2016   CREATININE 0.94 03/08/2016   CALCIUM 9.3 03/08/2016   PROT 7.0 03/08/2016   ALBUMIN 3.8 03/08/2016   AST 59 (H) 03/08/2016   ALT 41 03/08/2016   ALKPHOS 74 03/08/2016   BILITOT 0.5 03/08/2016   GFRNONAA 55 (L) 03/08/2016   GFRAA >60 03/08/2016    Lab Results  Component Value Date   WBC 6.4 07/11/2016   NEUTROABS 5.3 03/08/2016    HGB 13.8 07/11/2016   HCT 39.2 07/11/2016   MCV 92.4 07/11/2016   PLT 223 07/11/2016     STUDIES: No results found.  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 Mammoprint low risk.  PLAN:    1. 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 Patient completed adjuvant XRT in August 2017. Because of her hot flashes and difficulty sleeping, she has been instructed to discontinue letrozole for 4 weeks to assess if her symptoms improve. Previously, she could not tolerate anastrozole. If her symptoms improve, she will be switched to   either Aromasin or tamoxifen. Patient has been instructed to call clinic in 4 weeks to report on her symptoms. She will take an aromatase inhibitor for 5 years completing in August 2022. Baseline bone mineral density completed on August 02, 2016 revealed a T score of -0.5 which is considered normal. Consider repeating in 2 years in August 2019. Return to clinic in 3 months for further evaluation.   Approximately 30 minutes was spent in discussion of which greater than 50% was consultation.   Patient expressed understanding and was in agreement with this plan. She also understands that She can call clinic at any time with any questions, concerns, or complaints.    Timothy J Finnegan, MD   10/11/2016 10:47 PM      

## 2016-10-11 ENCOUNTER — Inpatient Hospital Stay: Payer: Medicare Other | Attending: Oncology | Admitting: Oncology

## 2016-10-11 VITALS — BP 146/75 | HR 73 | Temp 97.3°F | Resp 18 | Wt 162.9 lb

## 2016-10-11 DIAGNOSIS — M1990 Unspecified osteoarthritis, unspecified site: Secondary | ICD-10-CM | POA: Diagnosis not present

## 2016-10-11 DIAGNOSIS — Z85820 Personal history of malignant melanoma of skin: Secondary | ICD-10-CM

## 2016-10-11 DIAGNOSIS — M5431 Sciatica, right side: Secondary | ICD-10-CM | POA: Diagnosis not present

## 2016-10-11 DIAGNOSIS — G4700 Insomnia, unspecified: Secondary | ICD-10-CM | POA: Diagnosis not present

## 2016-10-11 DIAGNOSIS — M79604 Pain in right leg: Secondary | ICD-10-CM | POA: Diagnosis not present

## 2016-10-11 DIAGNOSIS — Z79811 Long term (current) use of aromatase inhibitors: Secondary | ICD-10-CM | POA: Insufficient documentation

## 2016-10-11 DIAGNOSIS — Z7982 Long term (current) use of aspirin: Secondary | ICD-10-CM | POA: Diagnosis not present

## 2016-10-11 DIAGNOSIS — M79602 Pain in left arm: Secondary | ICD-10-CM | POA: Insufficient documentation

## 2016-10-11 DIAGNOSIS — Z923 Personal history of irradiation: Secondary | ICD-10-CM | POA: Diagnosis not present

## 2016-10-11 DIAGNOSIS — Z79899 Other long term (current) drug therapy: Secondary | ICD-10-CM | POA: Insufficient documentation

## 2016-10-11 DIAGNOSIS — C50111 Malignant neoplasm of central portion of right female breast: Secondary | ICD-10-CM | POA: Diagnosis not present

## 2016-10-11 DIAGNOSIS — L538 Other specified erythematous conditions: Secondary | ICD-10-CM | POA: Insufficient documentation

## 2016-10-11 DIAGNOSIS — K219 Gastro-esophageal reflux disease without esophagitis: Secondary | ICD-10-CM | POA: Insufficient documentation

## 2016-10-11 DIAGNOSIS — K589 Irritable bowel syndrome without diarrhea: Secondary | ICD-10-CM | POA: Diagnosis not present

## 2016-10-11 DIAGNOSIS — Z170 Estrogen receptor positive status [ER+]: Secondary | ICD-10-CM | POA: Insufficient documentation

## 2016-10-11 DIAGNOSIS — R232 Flushing: Secondary | ICD-10-CM | POA: Diagnosis not present

## 2016-10-11 NOTE — Progress Notes (Signed)
States continues to have leg pain and the pain is worse in the right leg than the left. Has difficulty sleeping. Left arm stiffness in shoulder.

## 2016-11-13 ENCOUNTER — Telehealth: Payer: Self-pay | Admitting: *Deleted

## 2016-11-13 NOTE — Telephone Encounter (Signed)
Yes, restart.  Keep f/u as scheduled.

## 2016-11-13 NOTE — Telephone Encounter (Signed)
Been off her medicine (Aromasin) for a month now and does not think her problem was her med, asking if she should restart it. Please advise

## 2016-11-13 NOTE — Telephone Encounter (Signed)
Left message on VM to restart med and keep appt for Feb as scheduled. Also told to call back for any problems

## 2016-11-21 ENCOUNTER — Other Ambulatory Visit: Payer: Self-pay | Admitting: Surgery

## 2016-11-21 DIAGNOSIS — Z853 Personal history of malignant neoplasm of breast: Secondary | ICD-10-CM

## 2016-12-17 ENCOUNTER — Ambulatory Visit
Admission: RE | Admit: 2016-12-17 | Discharge: 2016-12-17 | Disposition: A | Discharge status: Home / Self Care | Payer: Medicare Other | Source: Ambulatory Visit | Attending: Surgery | Admitting: Surgery

## 2016-12-17 DIAGNOSIS — Z853 Personal history of malignant neoplasm of breast: Secondary | ICD-10-CM

## 2016-12-17 DIAGNOSIS — Z923 Personal history of irradiation: Secondary | ICD-10-CM | POA: Diagnosis not present

## 2016-12-18 ENCOUNTER — Other Ambulatory Visit: Payer: Self-pay | Admitting: Internal Medicine

## 2016-12-18 DIAGNOSIS — M5441 Lumbago with sciatica, right side: Secondary | ICD-10-CM

## 2016-12-18 DIAGNOSIS — M5442 Lumbago with sciatica, left side: Principal | ICD-10-CM

## 2017-01-02 ENCOUNTER — Ambulatory Visit
Admission: RE | Admit: 2017-01-02 | Discharge: 2017-01-02 | Disposition: A | Discharge status: Home / Self Care | Payer: Medicare Other | Source: Ambulatory Visit | Attending: Internal Medicine | Admitting: Internal Medicine

## 2017-01-02 DIAGNOSIS — M5126 Other intervertebral disc displacement, lumbar region: Secondary | ICD-10-CM | POA: Diagnosis not present

## 2017-01-02 DIAGNOSIS — M5124 Other intervertebral disc displacement, thoracic region: Secondary | ICD-10-CM | POA: Diagnosis not present

## 2017-01-02 DIAGNOSIS — M5441 Lumbago with sciatica, right side: Secondary | ICD-10-CM | POA: Insufficient documentation

## 2017-01-02 DIAGNOSIS — M4316 Spondylolisthesis, lumbar region: Secondary | ICD-10-CM | POA: Insufficient documentation

## 2017-01-02 DIAGNOSIS — M48061 Spinal stenosis, lumbar region without neurogenic claudication: Secondary | ICD-10-CM | POA: Diagnosis not present

## 2017-01-02 DIAGNOSIS — M5442 Lumbago with sciatica, left side: Secondary | ICD-10-CM | POA: Diagnosis present

## 2017-01-02 DIAGNOSIS — M1288 Other specific arthropathies, not elsewhere classified, other specified site: Secondary | ICD-10-CM | POA: Insufficient documentation

## 2017-01-13 NOTE — Progress Notes (Signed)
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  INTERVAL HISTORY: Patient returns to clinic today for routine 3 month evaluation. She continues to have right leg pain consistent with her history of sciatica. She also complains of hot flashes and difficulty sleeping. She has no other neurologic complaints. She denies any fevers. She has a good appetite and denies weight loss. She denies any other pain. She has no chest pain or shortness of breath. She denies any nausea, vomiting, constipation, or diarrhea. She has no urinary complaints. Patient offers no further specific complaints today.  REVIEW OF SYSTEMS:   Review of Systems  Constitutional: Negative.  Negative for fever, malaise/fatigue and weight loss.  Respiratory: Negative.  Negative for sputum production.   Cardiovascular: Negative.  Negative for chest pain and leg swelling.  Gastrointestinal: Negative.  Negative for abdominal pain.  Genitourinary: Negative.   Musculoskeletal: Positive for joint pain.  Skin: Negative.  Negative for itching and rash.  Neurological: Positive for sensory change. Negative for weakness.  Psychiatric/Behavioral: The patient has insomnia. The patient is not nervous/anxious.     As per HPI. Otherwise, a complete review of systems is negative.  PAST MEDICAL HISTORY: Past Medical History:  Diagnosis Date  . Bell's palsy   . Dermatitis contact, eyelid   . Environmental allergies   . Fibrocystic breast disease    being followed at [facility]   . GERD (gastroesophageal reflux disease)   . IBS (irritable bowel syndrome)   . Melanoma of skin ([place]) 2011   left upper arm  . Osteoarthritis   . Rosacea     PAST  SURGICAL HISTORY: Past Surgical History:  Procedure Laterality Date  . ABDOMINAL HYSTERECTOMY    . BREAST BIOPSY Right 02/29/2016   +  . BREAST EXCISIONAL BIOPSY Right 03/2016   + rad  . ENDOSCOPIC CARPAL TUNNEL RELEASE Right   . eyelid lift    . PARTIAL MASTECTOMY WITH NEEDLE LOCALIZATION Right 03/16/2016   Procedure: PARTIAL MASTECTOMY;  Surgeon: Leonie Green, MD;  Location: [facility] ORS;  Service: General;  Laterality: Right;  . repair tear ducts    . SENTINEL NODE BIOPSY Right 03/16/2016   Procedure: SENTINEL LYMPH NODE BIOPSY;  Surgeon: Leonie Green, MD;  Location: [facility] ORS;  Service: General;  Laterality: Right;    FAMILY HISTORY Family History  Problem Relation Age of Onset  . Skin cancer Brother     non-melanoma  . Skin cancer Father     non-melanoma  . Throat cancer Father   . Stroke Mother   . Breast cancer Neg Hx        ADVANCED DIRECTIVES:    HEALTH MAINTENANCE: Social History  Substance Use Topics  . Smoking status: Never Smoker  . Smokeless tobacco: Not on file  . Alcohol use No     Colonoscopy:  PAP:  Bone density:  Lipid panel:  Allergies  Allergen Reactions  . Benzalkonium Chloride Other (See Comments)    Positive patch test  . Garlic Diarrhea  . Methylisothiazolinone Other (See Comments)    Positive patch test Positive patch test    Current Outpatient Prescriptions  Medication Sig Dispense Refill  . anastrozole (ARIMIDEX) 1 MG tablet Take 1 tablet (1 mg total) by mouth  daily. 30 tablet 11  . anastrozole (ARIMIDEX) 1 MG tablet Take 1 mg by mouth.    Marland Kitchen aspirin EC 81 MG tablet Take 81 mg by mouth daily.     . Biotin 5000 MCG TABS Take 1 tablet by mouth daily.    . Cholecalciferol (VITAMIN D3) 1000 units CAPS Take by mouth daily.    . fexofenadine (ALLEGRA) 180 MG tablet Take by mouth.    . fluticasone (FLONASE) 50 MCG/ACT nasal spray Place 1 spray into the nose daily.     Marland Kitchen gentamicin ointment (GARAMYCIN) 0.1 %     . metroNIDAZOLE  (METROGEL) 0.75 % gel Apply 1 application topically 2 (two) times daily.     . naproxen sodium (ANAPROX) 220 MG tablet Take 220 mg by mouth 2 (two) times daily with a meal.    . Optical Supplies (OCUSOFT DRY EYE) KIT by Does not apply route.    Marland Kitchen Propylene Glycol (SYSTANE BALANCE OP) Apply 1 drop to eye 2 (two) times daily.    Marland Kitchen acetaminophen (TYLENOL) 500 MG tablet Take 500 mg by mouth every 6 (six) hours as needed.    Marland Kitchen HYDROcodone-acetaminophen (NORCO) 5-325 MG tablet Take 1-2 tablets by mouth every 4 (four) hours as needed for moderate pain. (Patient not taking: Reported on 01/14/2017) 12 tablet 0  . hyoscyamine (LEVBID) 0.375 MG 12 hr tablet Take by mouth.    . letrozole (FEMARA) 2.5 MG tablet     . RABEprazole (ACIPHEX) 20 MG tablet Take 20 mg by mouth 2 (two) times daily.      No current facility-administered medications for this visit.     OBJECTIVE: Vitals:   01/14/17 1439  BP: (!) 158/84  Pulse: 68  Temp: 97.8 F (36.6 C)     Body mass index is 29.38 kg/m.    ECOG FS:0 - Asymptomatic  General: Well-developed, well-nourished, no acute distress. Eyes: Pink conjunctiva, anicteric sclera. Breasts: Patient requested exam be deferred today. Lungs: Clear to auscultation bilaterally. Heart: Regular rate and rhythm. No rubs, murmurs, or gallops. Abdomen: Soft, nontender, nondistended. No organomegaly noted, normoactive bowel sounds. Musculoskeletal: No edema, cyanosis, or clubbing. Neuro: Alert, answering all questions appropriately. Cranial nerves grossly intact. Skin: No rashes or petechiae noted. Psych: Normal affect.   LAB RESULTS:  Lab Results  Component Value Date   NA 139 03/08/2016   K 3.9 03/08/2016   CL 105 03/08/2016   CO2 26 03/08/2016   GLUCOSE 104 (H) 03/08/2016   BUN 21 (H) 03/08/2016   CREATININE 0.94 03/08/2016   CALCIUM 9.3 03/08/2016   PROT 7.0 03/08/2016   ALBUMIN 3.8 03/08/2016   AST 59 (H) 03/08/2016   ALT 41 03/08/2016   ALKPHOS 74 03/08/2016     BILITOT 0.5 03/08/2016   GFRNONAA 55 (L) 03/08/2016   GFRAA >60 03/08/2016    Lab Results  Component Value Date   WBC 6.4 07/11/2016   NEUTROABS 5.3 03/08/2016   HGB 13.8 07/11/2016   HCT 39.2 07/11/2016   MCV 92.4 07/11/2016   PLT 223 07/11/2016     STUDIES: Mr Lumbar Spine Wo Contrast  Result Date: 01/02/2017 CLINICAL DATA:  Recurrent low back and right greater than left leg pain for 1 year. History of breast cancer. EXAM: MRI LUMBAR SPINE WITHOUT CONTRAST TECHNIQUE: Multiplanar, multisequence MR imaging of the lumbar spine was performed. No intravenous contrast was administered. COMPARISON:  03/05/2015 FINDINGS: Segmentation: Standard lumbar segmentation is designated, as on the prior study. Alignment: Unchanged grade 1 anterolisthesis of  L3 on L4 and L4 on L5, facet mediated. Slight left convex lumbar spine curvature. Vertebrae: Preserve vertebral body heights without evidence of fracture, suspicious osseous lesion, or significant marrow edema. L1 vertebral body hemangioma is unchanged. Conus medullaris: Extends to the T12 level and appears normal. Paraspinal and other soft tissues: Unremarkable. Disc levels: T11-12: Only imaged sagittally. Small central left paracentral disc extrusion without significant stenosis, unchanged. T12-L1:  Negative. L1-2: Mild facet hypertrophy without disc herniation or stenosis, unchanged. L2-3: Disc bulging, left foraminal to extraforaminal disc protrusion, and mild facet and ligamentum flavum hypertrophy result in mild right and mild-to-moderate left lateral recess stenosis, mild spinal stenosis, and mild left neural foraminal stenosis. Spinal and lateral recess stenosis are new/ increased from the prior study. L3-4: Moderate disc space narrowing. Listhesis with bulging uncovered disc, ligamentum flavum thickening, and advanced facet arthrosis result in severe spinal stenosis and mild-to-moderate left and mild right neural foraminal stenosis, overall stable  to at most minimally progressed from prior. L4-5: Moderate disc space narrowing. Listhesis with bulging uncovered disc, ligamentum flavum thickening, and severe facet arthrosis result in severe spinal stenosis, left greater than right lateral recess stenosis, and mild left neural foraminal stenosis. The small midline synovial cyst on the prior study is no longer visualized. However, spinal and lateral recess stenosis may have slightly worsened. L5-S1: Moderate to severe facet arthrosis is similar to the prior study with an unchanged 7 mm synovial cyst projecting anteriorly from the left facet joint and abutting but not compressing the exiting left L5 nerve. No disc herniation or spinal stenosis. IMPRESSION: 1. Mild spinal and mild to moderate lateral recess stenosis at L2-3, new/increased from prior. 2. Severe multifactorial spinal stenosis at L3-4 and L4-5, stable to slightly progressed. 3. Unchanged L5-S1 facet arthrosis with small left-sided synovial cyst near the left L5 nerve. Electronically Signed   By: Logan Bores M.D.   On: 01/02/2017 14:47   Mm Diag Breast Tomo Bilateral  Result Date: 12/17/2016 CLINICAL DATA:  Right lumpectomy with radiation therapy in 2017. EXAM: 2D DIGITAL DIAGNOSTIC BILATERAL MAMMOGRAM WITH CAD AND ADJUNCT TOMO COMPARISON:  02/21/2016 and earlier ACR Breast Density Category b: There are scattered areas of fibroglandular density. FINDINGS: No suspicious mass, distortion, or microcalcifications are identified to suggest presence of malignancy. Post operative changes are seen in the rightbreast. Mammographic images were processed with CAD. IMPRESSION: No mammographic evidence for malignancy. RECOMMENDATION: Diagnostic mammogram is suggested in 1 year. (Code:[id number]-B-01Y) I have discussed the findings and recommendations with the patient. Results were also provided in writing at the conclusion of the visit. If applicable, a reminder letter will be sent to the patient regarding the next  appointment. BI-RADS CATEGORY  2: Benign. Electronically Signed   By: Nolon Nations M.D.   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 Mammoprint low risk.  PLAN:    1. 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 Patient completed adjuvant XRT in August 2017. Her most recent mammogram on December 17, 2016 was reported as BI-RADS 2.  Because of her hot flashes and difficulty sleeping, letrozole was discontinued and patient is now on anastrozole. She continues to have hot flashes, but these are improved. Continue treatment for total 5 years completing in August 2022. Return to clinic in 3 months for further evaluation.  2. Postmenopausal: Baseline bone mineral density completed on August 02, 2016 revealed a T score of -0.5 which is considered normal. Consider repeating in 2 years in August 2019. Return to clinic in 3 months for further evaluation.   Patient expressed understanding and was in agreement with this plan. She also understands that She can call clinic at any time with any questions, concerns, or complaints.    Lloyd Huger, MD   01/14/2017 2:48 PM

## 2017-01-14 ENCOUNTER — Inpatient Hospital Stay: Payer: Medicare Other | Attending: Oncology | Admitting: Oncology

## 2017-01-14 VITALS — BP 158/84 | HR 68 | Temp 97.8°F | Wt 160.6 lb

## 2017-01-14 DIAGNOSIS — M1990 Unspecified osteoarthritis, unspecified site: Secondary | ICD-10-CM

## 2017-01-14 DIAGNOSIS — Z923 Personal history of irradiation: Secondary | ICD-10-CM

## 2017-01-14 DIAGNOSIS — M5431 Sciatica, right side: Secondary | ICD-10-CM | POA: Diagnosis not present

## 2017-01-14 DIAGNOSIS — Z780 Asymptomatic menopausal state: Secondary | ICD-10-CM

## 2017-01-14 DIAGNOSIS — Z79811 Long term (current) use of aromatase inhibitors: Secondary | ICD-10-CM | POA: Diagnosis not present

## 2017-01-14 DIAGNOSIS — C50111 Malignant neoplasm of central portion of right female breast: Secondary | ICD-10-CM | POA: Insufficient documentation

## 2017-01-14 DIAGNOSIS — R232 Flushing: Secondary | ICD-10-CM | POA: Insufficient documentation

## 2017-01-14 DIAGNOSIS — Z170 Estrogen receptor positive status [ER+]: Secondary | ICD-10-CM | POA: Diagnosis not present

## 2017-01-14 DIAGNOSIS — M79604 Pain in right leg: Secondary | ICD-10-CM

## 2017-01-14 DIAGNOSIS — Z85820 Personal history of malignant melanoma of skin: Secondary | ICD-10-CM | POA: Diagnosis not present

## 2017-01-14 DIAGNOSIS — Z79899 Other long term (current) drug therapy: Secondary | ICD-10-CM | POA: Diagnosis not present

## 2017-01-14 DIAGNOSIS — G4700 Insomnia, unspecified: Secondary | ICD-10-CM | POA: Diagnosis not present

## 2017-01-14 DIAGNOSIS — K219 Gastro-esophageal reflux disease without esophagitis: Secondary | ICD-10-CM | POA: Diagnosis not present

## 2017-01-14 DIAGNOSIS — K589 Irritable bowel syndrome without diarrhea: Secondary | ICD-10-CM | POA: Insufficient documentation

## 2017-01-14 DIAGNOSIS — L719 Rosacea, unspecified: Secondary | ICD-10-CM | POA: Insufficient documentation

## 2017-01-14 DIAGNOSIS — Z7982 Long term (current) use of aspirin: Secondary | ICD-10-CM | POA: Insufficient documentation

## 2017-01-14 NOTE — Progress Notes (Signed)
Patient ambulates without assistance, brought to exam room 8, accompanied by her husband

## 2017-01-18 ENCOUNTER — Other Ambulatory Visit: Payer: Self-pay | Admitting: *Deleted

## 2017-01-18 MED ORDER — ANASTROZOLE 1 MG PO TABS: 1.0000 mg | ORAL_TABLET | Freq: Every day | ORAL | 1 refills | Status: DC

## 2017-02-01 ENCOUNTER — Encounter: Payer: Self-pay | Admitting: *Deleted

## 2017-03-04 ENCOUNTER — Encounter: Payer: Self-pay | Admitting: Radiation Oncology

## 2017-03-04 ENCOUNTER — Ambulatory Visit
Admission: RE | Admit: 2017-03-04 | Discharge: 2017-03-04 | Disposition: A | Discharge status: Home / Self Care | Payer: Medicare Other | Source: Ambulatory Visit | Attending: Radiation Oncology | Admitting: Radiation Oncology

## 2017-03-04 VITALS — BP 151/68 | HR 71 | Temp 98.7°F | Resp 20 | Wt 158.0 lb

## 2017-03-04 DIAGNOSIS — M545 Low back pain: Secondary | ICD-10-CM | POA: Diagnosis not present

## 2017-03-04 DIAGNOSIS — Z170 Estrogen receptor positive status [ER+]: Secondary | ICD-10-CM | POA: Insufficient documentation

## 2017-03-04 DIAGNOSIS — Z923 Personal history of irradiation: Secondary | ICD-10-CM | POA: Insufficient documentation

## 2017-03-04 DIAGNOSIS — C50011 Malignant neoplasm of nipple and areola, right female breast: Secondary | ICD-10-CM | POA: Insufficient documentation

## 2017-03-04 DIAGNOSIS — Z79811 Long term (current) use of aromatase inhibitors: Secondary | ICD-10-CM | POA: Insufficient documentation

## 2017-03-04 NOTE — Progress Notes (Signed)
Radiation Oncology Follow up Note  Name: Katherine Gates   Date:   03/04/2017 MRN:  [id number] DOB: 25-Jul-1933    This 83 y.o. female presents to the clinic today for six-month follow-up status post whole breast radiation to her right breast for stage I ER/PR positive HER-2/neu negative invasive mammary carcinoma status post wide local excision.  REFERRING PROVIDER: Idelle Crouch, MD  HPI: Patient is a 83 year old female now out 6 months having completed whole breast radiation to her right breast for stage I ER/PR positive HER-2/neu negative invasive mammary carcinoma status post wide local excision seen today in routine follow-up she is doing well. She specifically denies breast tenderness cough or bone pain she does have lower back pain which is being looked after by an neurosurgeon.. Last mammogram was in January 2018 BI-RADS 2 benign. She had an MRI scan also back in January 2018 showing mild spinal stenosis at L2-3 and spinal stenosis L3-4 and L4-5. She's currently on Arimidex time that well without side effect.  COMPLICATIONS OF TREATMENT: none  FOLLOW UP COMPLIANCE: keeps appointments   PHYSICAL EXAM:  BP (!) 151/68   Pulse 71   Temp 98.7 F (37.1 C)   Resp 20   Wt 157 lb 15.4 oz (71.6 kg)   BMI 28.89 kg/m  Lungs are clear to A&P cardiac examination essentially unremarkable with regular rate and rhythm. No dominant mass or nodularity is noted in either breast in 2 positions examined. Incision is well-healed. No axillary or supraclavicular adenopathy is appreciated. Cosmetic result is excellent. Well-developed well-nourished patient in NAD. HEENT reveals PERLA, EOMI, discs not visualized.  Oral cavity is clear. No oral mucosal lesions are identified. Neck is clear without evidence of cervical or supraclavicular adenopathy. Lungs are clear to A&P. Cardiac examination is essentially unremarkable with regular rate and rhythm without murmur rub or thrill. Abdomen is benign with no  organomegaly or masses noted. Motor sensory and DTR levels are equal and symmetric in the upper and lower extremities. Cranial nerves II through XII are grossly intact. Proprioception is intact. No peripheral adenopathy or edema is identified. No motor or sensory levels are noted. Crude visual fields are within normal range.  RADIOLOGY RESULTS: Mammograms and lump bar MRI scan reviewed compatible with the above-stated findings  PLAN: Present time she is doing well with no evidence of disease. I'm please were overall progress. She is scheduled already for January mammograms. She continues on room and asked without side effect. She continues under neurosurgeon's care for her back complaints. I have asked to see her back in 6 months for follow-up and then will go to once your follow-up appointments.  I would like to take this opportunity to thank you for allowing me to participate in the care of your patient.Armstead Peaks., MD

## 2017-03-08 ENCOUNTER — Ambulatory Visit: Payer: Medicare Other | Admitting: Radiation Oncology

## 2017-04-14 NOTE — Progress Notes (Signed)
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  INTERVAL HISTORY: Patient returns to clinic today for routine 3 month evaluation. She continues to have hot flashes and difficulty sleeping. She has no other neurologic complaints. She denies any fevers. She has a good appetite and denies weight loss. She denies any other pain. She has no chest pain or shortness of breath. She denies any nausea, vomiting, constipation, or diarrhea. She has no urinary complaints. Patient offers no further specific complaints today.  REVIEW OF SYSTEMS:   Review of Systems  Constitutional: Negative.  Negative for fever, malaise/fatigue and weight loss.  Respiratory: Negative.  Negative for sputum production.   Cardiovascular: Negative.  Negative for chest pain and leg swelling.  Gastrointestinal: Negative.  Negative for abdominal pain.  Genitourinary: Negative.   Musculoskeletal: Positive for joint pain.  Skin: Negative.  Negative for itching and rash.  Neurological: Positive for sensory change. Negative for weakness.  Psychiatric/Behavioral: The patient has insomnia. The patient is not nervous/anxious.     As per HPI. Otherwise, a complete review of systems is negative.  PAST MEDICAL HISTORY: Past Medical History:  Diagnosis Date  . Bell's palsy   . Dermatitis contact, eyelid   . Environmental allergies   . Fibrocystic breast disease    being followed at [facility]   . GERD (gastroesophageal reflux disease)   . IBS (irritable bowel syndrome)   . Melanoma of skin ([place]) 2011   left upper arm  . Osteoarthritis   . Rosacea     PAST SURGICAL HISTORY: Past Surgical History:  Procedure Laterality Date  .  ABDOMINAL HYSTERECTOMY    . BREAST BIOPSY Right 02/29/2016   +  . BREAST EXCISIONAL BIOPSY Right 03/2016   + rad  . ENDOSCOPIC CARPAL TUNNEL RELEASE Right   . eyelid lift    . PARTIAL MASTECTOMY WITH NEEDLE LOCALIZATION Right 03/16/2016   Procedure: PARTIAL MASTECTOMY;  Surgeon: Leonie Green, MD;  Location: [facility] ORS;  Service: General;  Laterality: Right;  . repair tear ducts    . SENTINEL NODE BIOPSY Right 03/16/2016   Procedure: SENTINEL LYMPH NODE BIOPSY;  Surgeon: Leonie Green, MD;  Location: [facility] ORS;  Service: General;  Laterality: Right;    FAMILY HISTORY Family History  Problem Relation Age of Onset  . Skin cancer Brother     non-melanoma  . Skin cancer Father     non-melanoma  . Throat cancer Father   . Stroke Mother   . Breast cancer Neg Hx        ADVANCED DIRECTIVES:    HEALTH MAINTENANCE: Social History  Substance Use Topics  . Smoking status: Never Smoker  . Smokeless tobacco: Never Used  . Alcohol use No     Colonoscopy:  PAP:  Bone density:  Lipid panel:  Allergies  Allergen Reactions  . Benzalkonium Chloride Other (See Comments)    Positive patch test  . Garlic Diarrhea  . Methylisothiazolinone Other (See Comments)    Positive patch test Positive patch test    Current Outpatient Prescriptions  Medication Sig Dispense Refill  . acetaminophen (TYLENOL) 500 MG tablet Take 500 mg by mouth every 6 (six) hours as needed.    Marland Kitchen anastrozole (ARIMIDEX) 1 MG tablet Take 1  tablet (1 mg total) by mouth daily. 90 tablet 1  . aspirin EC 81 MG tablet Take 81 mg by mouth daily.     . Biotin 5000 MCG TABS Take 1 tablet by mouth daily.    . Cholecalciferol (VITAMIN D3) 1000 units CAPS Take by mouth daily.    . fexofenadine (ALLEGRA) 180 MG tablet Take by mouth.    . fluticasone (FLONASE) 50 MCG/ACT nasal spray Place 1 spray into the nose daily.     Marland Kitchen gentamicin ointment (GARAMYCIN) 0.1 %     . HYDROcodone-acetaminophen (NORCO) 5-325 MG tablet Take  1-2 tablets by mouth every 4 (four) hours as needed for moderate pain. 12 tablet 0  . hyoscyamine (LEVBID) 0.375 MG 12 hr tablet Take by mouth.    . metroNIDAZOLE (METROGEL) 0.75 % gel Apply 1 application topically 2 (two) times daily.     . naproxen sodium (ANAPROX) 220 MG tablet Take 220 mg by mouth 2 (two) times daily with a meal.    . Optical Supplies (OCUSOFT DRY EYE) KIT by Does not apply route.    Marland Kitchen Propylene Glycol (SYSTANE BALANCE OP) Apply 1 drop to eye 2 (two) times daily.    . RABEprazole (ACIPHEX) 20 MG tablet Take 20 mg by mouth 2 (two) times daily.      No current facility-administered medications for this visit.     OBJECTIVE: Vitals:   04/15/17 1440  BP: (!) 151/72  Pulse: 75  Temp: 98.4 F (36.9 C)     Body mass index is 28.81 kg/m.    ECOG FS:0 - Asymptomatic  General: Well-developed, well-nourished, no acute distress. Eyes: Pink conjunctiva, anicteric sclera. Breasts: Patient requested exam be deferred today. Lungs: Clear to auscultation bilaterally. Heart: Regular rate and rhythm. No rubs, murmurs, or gallops. Abdomen: Soft, nontender, nondistended. No organomegaly noted, normoactive bowel sounds. Musculoskeletal: No edema, cyanosis, or clubbing. Neuro: Alert, answering all questions appropriately. Cranial nerves grossly intact. Skin: No rashes or petechiae noted. Psych: Normal affect.   LAB RESULTS:  Lab Results  Component Value Date   NA 139 03/08/2016   K 3.9 03/08/2016   CL 105 03/08/2016   CO2 26 03/08/2016   GLUCOSE 104 (H) 03/08/2016   BUN 21 (H) 03/08/2016   CREATININE 0.94 03/08/2016   CALCIUM 9.3 03/08/2016   PROT 7.0 03/08/2016   ALBUMIN 3.8 03/08/2016   AST 59 (H) 03/08/2016   ALT 41 03/08/2016   ALKPHOS 74 03/08/2016   BILITOT 0.5 03/08/2016   GFRNONAA 55 (L) 03/08/2016   GFRAA >60 03/08/2016    Lab Results  Component Value Date   WBC 6.4 07/11/2016   NEUTROABS 5.3 03/08/2016   HGB 13.8 07/11/2016   HCT 39.2 07/11/2016    MCV 92.4 07/11/2016   PLT 223 07/11/2016     STUDIES: No results found.  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 Mammoprint low risk.  PLAN:    1. 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 Patient completed adjuvant XRT in August 2017. Her most recent mammogram on December 17, 2016 was reported as BI-RADS 2.  Because of her hot flashes and difficulty sleeping, letrozole was discontinued and patient is now on anastrozole. She continues to have hot flashes, but has elected to continue treatment as prescribed for total 5 years completing in August 2022.  Patient was offered Effexor as well as to switch treatment again, but she declined.  Return to clinic in 6 months for further evaluation. 2. Postmenopausal: Baseline bone mineral density completed on August 02, 2016 revealed a T score of -0.5 which is considered normal. Consider repeating in 2 years in August 2019.   Patient expressed understanding and was in agreement with this plan. She also understands that She can call clinic at any time with any questions, concerns, or complaints.    Lloyd Huger, MD   04/15/2017 5:14 PM

## 2017-04-15 ENCOUNTER — Encounter: Payer: Self-pay | Admitting: Oncology

## 2017-04-15 ENCOUNTER — Inpatient Hospital Stay: Payer: Medicare Other | Attending: Oncology | Admitting: Oncology

## 2017-04-15 VITALS — BP 151/72 | HR 75 | Temp 98.4°F | Wt 157.5 lb

## 2017-04-15 DIAGNOSIS — Z7982 Long term (current) use of aspirin: Secondary | ICD-10-CM | POA: Diagnosis not present

## 2017-04-15 DIAGNOSIS — Z85820 Personal history of malignant melanoma of skin: Secondary | ICD-10-CM | POA: Insufficient documentation

## 2017-04-15 DIAGNOSIS — C50111 Malignant neoplasm of central portion of right female breast: Secondary | ICD-10-CM | POA: Diagnosis not present

## 2017-04-15 DIAGNOSIS — Z780 Asymptomatic menopausal state: Secondary | ICD-10-CM | POA: Diagnosis not present

## 2017-04-15 DIAGNOSIS — K589 Irritable bowel syndrome without diarrhea: Secondary | ICD-10-CM | POA: Diagnosis not present

## 2017-04-15 DIAGNOSIS — M1990 Unspecified osteoarthritis, unspecified site: Secondary | ICD-10-CM | POA: Diagnosis not present

## 2017-04-15 DIAGNOSIS — Z79811 Long term (current) use of aromatase inhibitors: Secondary | ICD-10-CM | POA: Insufficient documentation

## 2017-04-15 DIAGNOSIS — L719 Rosacea, unspecified: Secondary | ICD-10-CM | POA: Diagnosis not present

## 2017-04-15 DIAGNOSIS — Z79899 Other long term (current) drug therapy: Secondary | ICD-10-CM | POA: Diagnosis not present

## 2017-04-15 DIAGNOSIS — Z170 Estrogen receptor positive status [ER+]: Secondary | ICD-10-CM

## 2017-04-15 DIAGNOSIS — G4700 Insomnia, unspecified: Secondary | ICD-10-CM | POA: Insufficient documentation

## 2017-04-15 DIAGNOSIS — K219 Gastro-esophageal reflux disease without esophagitis: Secondary | ICD-10-CM

## 2017-04-15 DIAGNOSIS — Z923 Personal history of irradiation: Secondary | ICD-10-CM | POA: Diagnosis not present

## 2017-04-15 DIAGNOSIS — R232 Flushing: Secondary | ICD-10-CM | POA: Diagnosis not present

## 2017-05-03 ENCOUNTER — Telehealth: Payer: Self-pay | Admitting: *Deleted

## 2017-05-03 MED ORDER — VENLAFAXINE HCL ER 37.5 MG PO CP24: 37.5000 mg | ORAL_CAPSULE | Freq: Every day | ORAL | 0 refills | Status: DC

## 2017-05-03 NOTE — Telephone Encounter (Signed)
Per VO Dr  Effexor 37.5 mg daily. patient notified by msg on her phone that rx sent

## 2017-05-03 NOTE — Telephone Encounter (Signed)
Requesting Effexor Rx as offered at last OV

## 2017-05-30 ENCOUNTER — Other Ambulatory Visit: Payer: Self-pay | Admitting: *Deleted

## 2017-05-30 MED ORDER — VENLAFAXINE HCL ER 37.5 MG PO CP24: 37.5000 mg | ORAL_CAPSULE | Freq: Every day | ORAL | 2 refills | Status: DC

## 2017-07-01 ENCOUNTER — Other Ambulatory Visit: Payer: Self-pay | Admitting: Oncology

## 2017-10-16 NOTE — Progress Notes (Signed)
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  INTERVAL HISTORY: Patient returns to clinic today for routine 6 month evaluation. She is tolerating anastrozole well other than continued daily hot flashes and difficulty sleeping. She has no other neurologic complaints. She denies any fevers. She has a good appetite and denies weight loss. She denies any other pain. She has no chest pain or shortness of breath. She denies any nausea, vomiting, constipation, or diarrhea. She has no urinary complaints. Patient offers no further specific complaints today.  REVIEW OF SYSTEMS:   Review of Systems  Constitutional: Negative.  Negative for fever, malaise/fatigue and weight loss.  Respiratory: Negative.  Negative for sputum production.   Cardiovascular: Negative.  Negative for chest pain and leg swelling.  Gastrointestinal: Negative.  Negative for abdominal pain.  Genitourinary: Negative.   Musculoskeletal: Positive for joint pain.  Skin: Negative.  Negative for itching and rash.  Neurological: Positive for sensory change. Negative for weakness.  Psychiatric/Behavioral: The patient has insomnia. The patient is not nervous/anxious.     As per HPI. Otherwise, a complete review of systems is negative.  PAST MEDICAL HISTORY: Past Medical History:  Diagnosis Date  . Bell's palsy   . Dermatitis contact, eyelid   . Environmental allergies   . Fibrocystic breast disease    being [facility] at [facility]   . GERD (gastroesophageal reflux disease)   . IBS (irritable bowel syndrome)   . Melanoma of skin ([place]) 2011   left upper arm  . Osteoarthritis   . Rosacea     PAST SURGICAL HISTORY: Past Surgical History:   Procedure Laterality Date  . ABDOMINAL HYSTERECTOMY    . BREAST BIOPSY Right 02/29/2016   +  . BREAST EXCISIONAL BIOPSY Right 03/2016   + rad  . ENDOSCOPIC CARPAL TUNNEL RELEASE Right   . eyelid lift    . repair tear ducts      FAMILY HISTORY Family History  Problem Relation Age of Onset  . Skin cancer Brother        non-melanoma  . Skin cancer Father        non-melanoma  . Throat cancer Father   . Stroke Mother   . Breast cancer Neg Hx        ADVANCED DIRECTIVES:    HEALTH MAINTENANCE: Social History   Tobacco Use  . Smoking status: Never Smoker  . Smokeless tobacco: Never Used  Substance Use Topics  . Alcohol use: No    Alcohol/week: 0.0 oz  . Drug use: No     Colonoscopy:  PAP:  Bone density:  Lipid panel:  Allergies  Allergen Reactions  . Benzalkonium Chloride Other (See Comments)    Positive patch test  . Garlic Diarrhea  . Methylisothiazolinone Other (See Comments)    Positive patch test Positive patch test    Current Outpatient Medications  Medication Sig Dispense Refill  . anastrozole (ARIMIDEX) 1 MG tablet TAKE 1 TABLET DAILY 90 tablet 1  . aspirin EC 81 MG tablet Take 81 mg by mouth daily.     . Biotin 5000 MCG TABS Take 1 tablet by mouth daily.    . Cholecalciferol (VITAMIN D3) 1000 units CAPS Take by mouth daily.    . fexofenadine (ALLEGRA) 180  MG tablet Take by mouth.    . fluticasone (FLONASE) 50 MCG/ACT nasal spray Place 1 spray into the nose daily.     Marland Kitchen gentamicin ointment (GARAMYCIN) 0.1 %     . metroNIDAZOLE (METROGEL) 0.75 % gel Apply 1 application topically 2 (two) times daily.     . naproxen sodium (ANAPROX) 220 MG tablet Take 220 mg by mouth 2 (two) times daily with a meal.    . Optical Supplies (OCUSOFT DRY EYE) KIT by Does not apply route.    Marland Kitchen Propylene Glycol (SYSTANE BALANCE OP) Apply 1 drop to eye 2 (two) times daily.    . RABEprazole (ACIPHEX) 20 MG tablet Take 20 mg by mouth 2 (two) times daily.      No current  facility-administered medications for this visit.     OBJECTIVE: Vitals:   10/17/17 1442  BP: (!) 153/74  Pulse: 71  Resp: 18  Temp: 97.9 F (36.6 C)     Body mass index is 28.77 kg/m.    ECOG FS:0 - Asymptomatic  General: Well-developed, well-nourished, no acute distress. Eyes: Pink conjunctiva, anicteric sclera. Breasts: Patient requested exam be deferred today. Lungs: Clear to auscultation bilaterally. Heart: Regular rate and rhythm. No rubs, murmurs, or gallops. Abdomen: Soft, nontender, nondistended. No organomegaly noted, normoactive bowel sounds. Musculoskeletal: No edema, cyanosis, or clubbing. Neuro: Alert, answering all questions appropriately. Cranial nerves grossly intact. Skin: No rashes or petechiae noted. Psych: Normal affect.   LAB RESULTS:  Lab Results  Component Value Date   NA 139 03/08/2016   K 3.9 03/08/2016   CL 105 03/08/2016   CO2 26 03/08/2016   GLUCOSE 104 (H) 03/08/2016   BUN 21 (H) 03/08/2016   CREATININE 0.94 03/08/2016   CALCIUM 9.3 03/08/2016   PROT 7.0 03/08/2016   ALBUMIN 3.8 03/08/2016   AST 59 (H) 03/08/2016   ALT 41 03/08/2016   ALKPHOS 74 03/08/2016   BILITOT 0.5 03/08/2016   GFRNONAA 55 (L) 03/08/2016   GFRAA >60 03/08/2016    Lab Results  Component Value Date   WBC 6.4 07/11/2016   NEUTROABS 5.3 03/08/2016   HGB 13.8 07/11/2016   HCT 39.2 07/11/2016   MCV 92.4 07/11/2016   PLT 223 07/11/2016      STUDIES: No results found.  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 Mammoprint low risk.  PLAN:    1. 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 Patient completed adjuvant XRT in August 2017. Her most recent mammogram on December 17, 2016 was reported as BI-RADS 2.  Repeat in January 2019.   because of her hot flashes and difficulty sleeping, letrozole was discontinued and patient is now on anastrozole. She continues to have hot flashes, but has elected to continue treatment as prescribed for total 5 years completing in August 2022. Patient was offered Effexor as well as to switch treatment again, but she declined.  She is going to attempt to stop treatment altogether for 1 month to see if her hot flashes resolved.  She has been instructed to call clinic if she restarts treatment.  Return to clinic in 6 months for further evaluation. 2. Postmenopausal: Baseline bone mineral density completed on August 02, 2016 revealed a T score of -0.5 which is considered normal. Consider repeating in 2 years in August 2019.   Patient  expressed understanding and was in agreement with this plan. She also understands that She can call clinic at any time with any questions, concerns, or complaints.    Lloyd Huger, MD   10/17/2017 3:07 PM

## 2017-10-17 ENCOUNTER — Inpatient Hospital Stay: Payer: Medicare Other | Attending: Oncology | Admitting: Oncology

## 2017-10-17 VITALS — BP 153/74 | HR 71 | Temp 97.9°F | Resp 18 | Wt 157.3 lb

## 2017-10-17 DIAGNOSIS — Z85820 Personal history of malignant melanoma of skin: Secondary | ICD-10-CM | POA: Insufficient documentation

## 2017-10-17 DIAGNOSIS — N6019 Diffuse cystic mastopathy of unspecified breast: Secondary | ICD-10-CM | POA: Insufficient documentation

## 2017-10-17 DIAGNOSIS — Z923 Personal history of irradiation: Secondary | ICD-10-CM | POA: Insufficient documentation

## 2017-10-17 DIAGNOSIS — Z90710 Acquired absence of both cervix and uterus: Secondary | ICD-10-CM | POA: Diagnosis not present

## 2017-10-17 DIAGNOSIS — Z170 Estrogen receptor positive status [ER+]: Secondary | ICD-10-CM | POA: Diagnosis not present

## 2017-10-17 DIAGNOSIS — Z79899 Other long term (current) drug therapy: Secondary | ICD-10-CM | POA: Insufficient documentation

## 2017-10-17 DIAGNOSIS — Z800 Family history of malignant neoplasm of digestive organs: Secondary | ICD-10-CM

## 2017-10-17 DIAGNOSIS — Z79811 Long term (current) use of aromatase inhibitors: Secondary | ICD-10-CM | POA: Insufficient documentation

## 2017-10-17 DIAGNOSIS — M1990 Unspecified osteoarthritis, unspecified site: Secondary | ICD-10-CM | POA: Diagnosis not present

## 2017-10-17 DIAGNOSIS — K589 Irritable bowel syndrome without diarrhea: Secondary | ICD-10-CM | POA: Insufficient documentation

## 2017-10-17 DIAGNOSIS — Z808 Family history of malignant neoplasm of other organs or systems: Secondary | ICD-10-CM | POA: Insufficient documentation

## 2017-10-17 DIAGNOSIS — G4700 Insomnia, unspecified: Secondary | ICD-10-CM | POA: Insufficient documentation

## 2017-10-17 DIAGNOSIS — Z780 Asymptomatic menopausal state: Secondary | ICD-10-CM | POA: Diagnosis not present

## 2017-10-17 DIAGNOSIS — R232 Flushing: Secondary | ICD-10-CM

## 2017-10-17 DIAGNOSIS — Z7982 Long term (current) use of aspirin: Secondary | ICD-10-CM | POA: Diagnosis not present

## 2017-10-17 DIAGNOSIS — K219 Gastro-esophageal reflux disease without esophagitis: Secondary | ICD-10-CM | POA: Insufficient documentation

## 2017-10-17 DIAGNOSIS — C50111 Malignant neoplasm of central portion of right female breast: Secondary | ICD-10-CM | POA: Insufficient documentation

## 2017-12-17 ENCOUNTER — Encounter: Payer: Self-pay | Admitting: Podiatry

## 2017-12-17 ENCOUNTER — Ambulatory Visit (INDEPENDENT_AMBULATORY_CARE_PROVIDER_SITE_OTHER): Payer: Medicare Other | Admitting: Podiatry

## 2017-12-17 DIAGNOSIS — M722 Plantar fascial fibromatosis: Secondary | ICD-10-CM | POA: Diagnosis not present

## 2017-12-17 MED ORDER — MELOXICAM 15 MG PO TABS: 15.0000 mg | ORAL_TABLET | Freq: Every day | ORAL | 1 refills | Status: AC

## 2017-12-18 IMAGING — MG MM DIGITAL DIAGNOSTIC UNILAT*R*
2 series · 2 of 2 positions shown · non-contrast
Comparison: Previous exam(s).

CLINICAL DATA: Status post ultrasound-guided right breast biopsy

EXAM:
DIAGNOSTIC RIGHT MAMMOGRAM POST ULTRASOUND BIOPSY

[R CC]
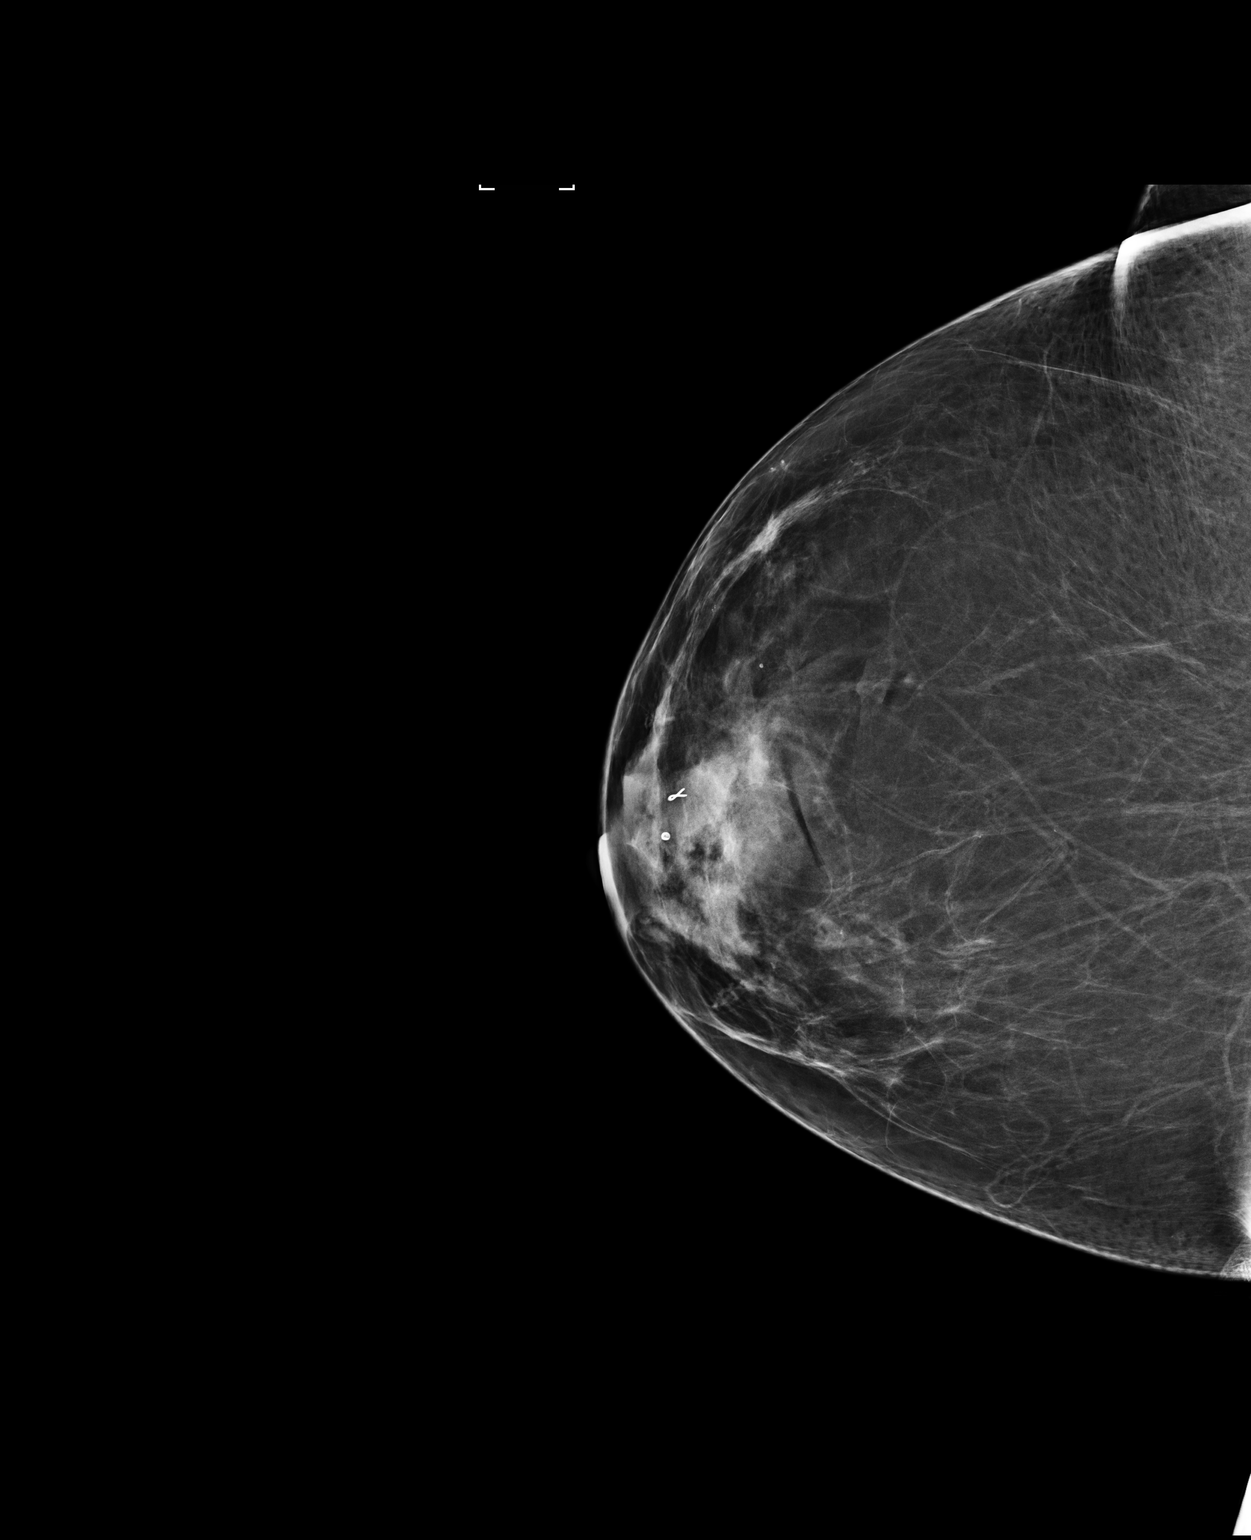

[R ML]
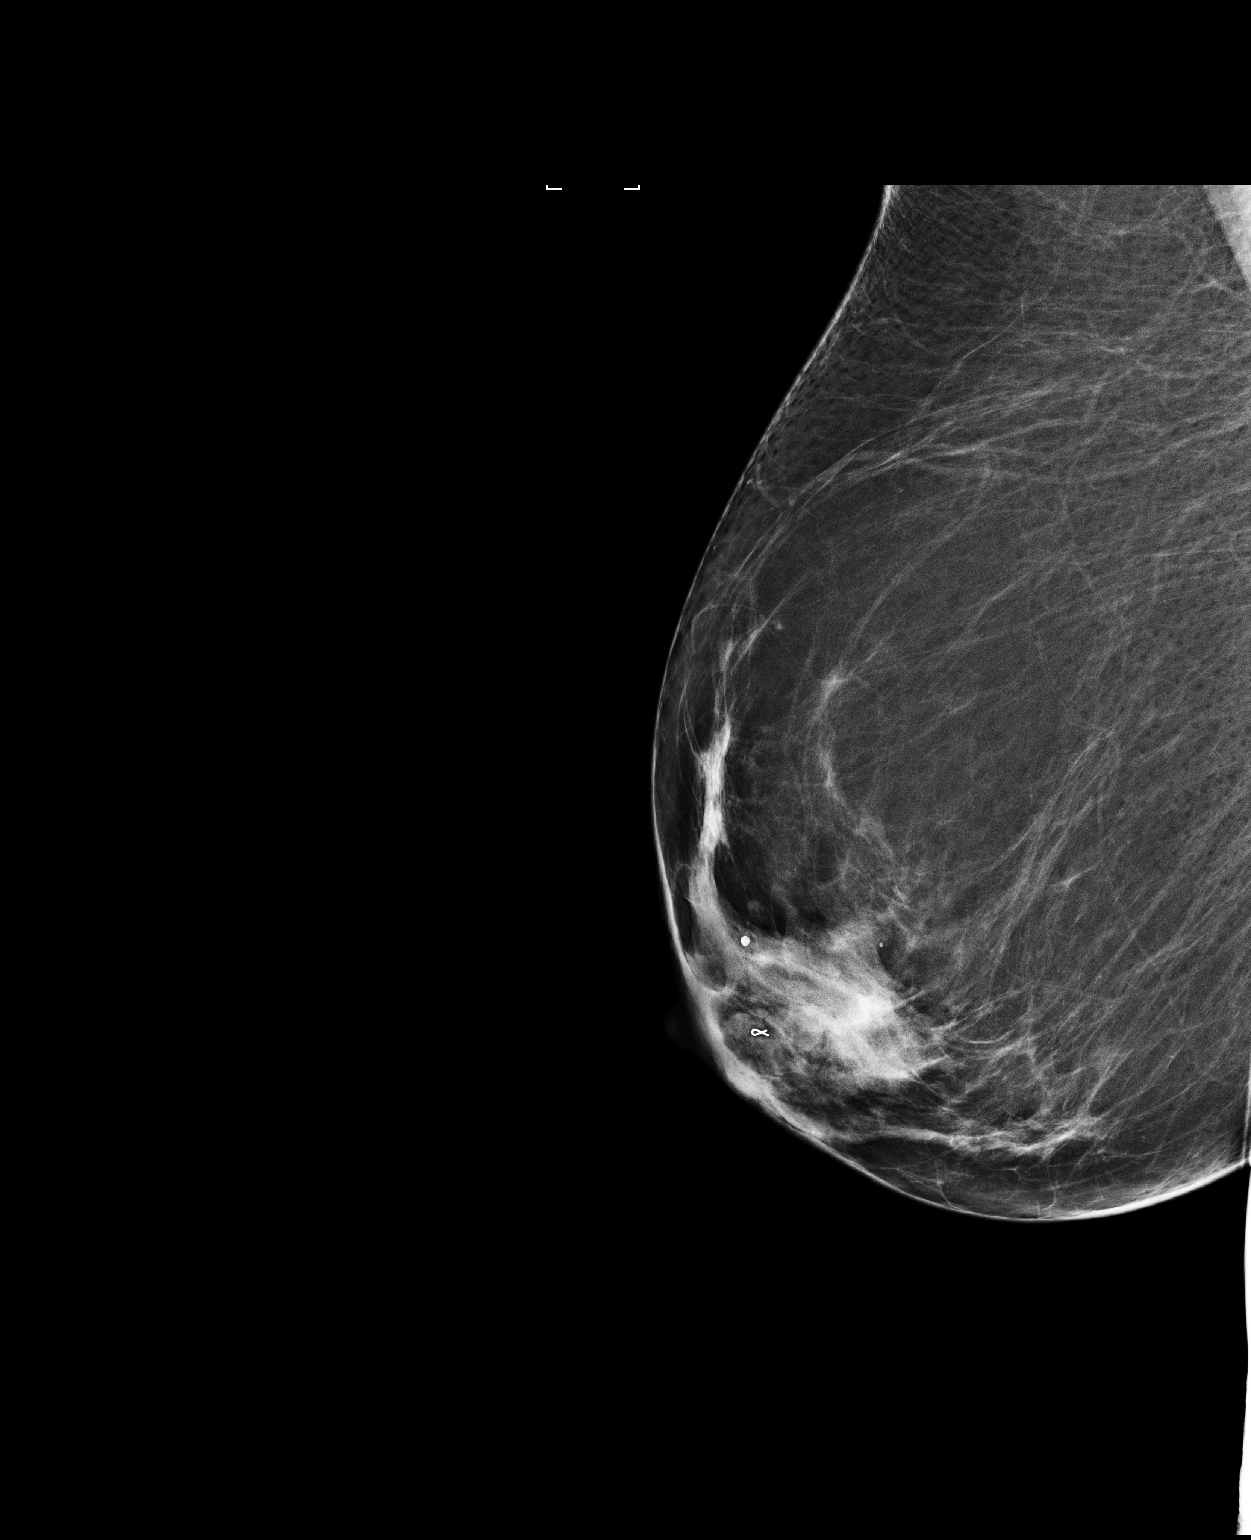

[2 of 2 positions shown; findings below may reference images not displayed]

FINDINGS: Mammographic images were obtained following ultrasound guided biopsy
of an indeterminate right breast mass at 7 o'clock, 1 cm from the
nipple. Post biopsy mammogram demonstrates the ribbon shaped biopsy
marker in the expected location within the slightly lower, outer
right breast.
IMPRESSION: Satisfactory marker placement as above.

Final Assessment: Post Procedure Mammograms for Marker Placement

## 2017-12-18 NOTE — Progress Notes (Signed)
   Subjective: Patient presents today for pain and tenderness to the plantar aspect of the right heel that began 2-3 weeks ago.  She states she has seen Dr. Milinda Pointer in the past for plantar fasciitis and received injections which helped alleviate the pain. Patient states that it hurts in the mornings with the first steps out of bed.  Patient presents today for further treatment and evaluation.   Past Medical History:  Diagnosis Date  . Bell's palsy   . Dermatitis contact, eyelid   . Environmental allergies   . Fibrocystic breast disease    being followed at [facility]   . GERD (gastroesophageal reflux disease)   . IBS (irritable bowel syndrome)   . Melanoma of skin ([place]) 2011   left upper arm  . Osteoarthritis   . Rosacea      Objective: Physical Exam General: The patient is alert and oriented x3 in no acute distress.  Dermatology: Skin is warm, dry and supple bilateral lower extremities. Negative for open lesions or macerations bilateral.   Vascular: Dorsalis Pedis and Posterior Tibial pulses palpable bilateral.  Capillary fill time is immediate to all digits.  Neurological: Epicritic and protective threshold intact bilateral.   Musculoskeletal: Tenderness to palpation at the medial calcaneal tubercale and through the insertion of the plantar fascia of the right foot. All other joints range of motion within normal limits bilateral. Strength 5/5 in all groups bilateral.   Assessment: 1. Plantar fasciitis right 2. Pain in right foot  Plan of Care:  1. Patient evaluated.  2. Injection of 0.5cc Celestone soluspan injected into the right plantar fascia  3. Rx for meloxicam 15 mg provided to patient. 4. Continue wearing plantar fascia brace. 5. Instructed patient regarding therapies and modalities at home to alleviate symptoms.  6. Return to clinic as needed.   Edrick Kins, DPM [facility]  Dr. Edrick Kins, DPM    [address] [place],  [address]                Office [phone]  Fax [phone]

## 2017-12-20 ENCOUNTER — Ambulatory Visit
Admission: RE | Admit: 2017-12-20 | Discharge: 2017-12-20 | Disposition: A | Discharge status: Home / Self Care | Payer: Medicare Other | Source: Ambulatory Visit | Attending: Oncology | Admitting: Oncology

## 2017-12-20 DIAGNOSIS — Z170 Estrogen receptor positive status [ER+]: Secondary | ICD-10-CM | POA: Diagnosis present

## 2017-12-20 DIAGNOSIS — Z9889 Other specified postprocedural states: Secondary | ICD-10-CM | POA: Insufficient documentation

## 2017-12-20 DIAGNOSIS — C50111 Malignant neoplasm of central portion of right female breast: Secondary | ICD-10-CM

## 2017-12-20 DIAGNOSIS — Z853 Personal history of malignant neoplasm of breast: Secondary | ICD-10-CM | POA: Insufficient documentation

## 2017-12-20 HISTORY — DX: Personal history of irradiation: Z92.3

## 2017-12-20 HISTORY — DX: Malignant neoplasm of unspecified site of unspecified female breast: C50.919

## 2017-12-31 ENCOUNTER — Ambulatory Visit (INDEPENDENT_AMBULATORY_CARE_PROVIDER_SITE_OTHER): Payer: Medicare Other | Admitting: Podiatry

## 2017-12-31 ENCOUNTER — Encounter: Payer: Self-pay | Admitting: Podiatry

## 2017-12-31 DIAGNOSIS — M722 Plantar fascial fibromatosis: Secondary | ICD-10-CM | POA: Diagnosis not present

## 2018-01-04 NOTE — Progress Notes (Signed)
   Subjective: 84 year old female presenting today for continued pain in the right heel secondary to plantar fasciitis that began about one month ago. She states the injection she received at her previous visit helped alleviate some of the pain. Taking Mobic also helps with the pain. She is also interested in custom molded orthotics. Patient presents today for further treatment and evaluation.   Past Medical History:  Diagnosis Date  . Bell's palsy   . Breast cancer ([place]) 2017   right  . Dermatitis contact, eyelid   . Environmental allergies   . Fibrocystic breast disease    being followed at [facility]   . GERD (gastroesophageal reflux disease)   . IBS (irritable bowel syndrome)   . Melanoma of skin ([place]) 2011   left upper arm  . Osteoarthritis   . Personal history of radiation therapy   . Rosacea      Objective: Physical Exam General: The patient is alert and oriented x3 in no acute distress.  Dermatology: Skin is warm, dry and supple bilateral lower extremities. Negative for open lesions or macerations bilateral.   Vascular: Dorsalis Pedis and Posterior Tibial pulses palpable bilateral.  Capillary fill time is immediate to all digits.  Neurological: Epicritic and protective threshold intact bilateral.   Musculoskeletal: Tenderness to palpation at the medial calcaneal tubercale and through the insertion of the plantar fascia of the right foot. All other joints range of motion within normal limits bilateral. Strength 5/5 in all groups bilateral.   Assessment: 1. Plantar fasciitis right 2. Pain in right foot  Plan of Care:  1. Patient evaluated.  2. Injection of 0.5cc Celestone soluspan injected into the right plantar fascia. 3. Continue taking meloxicam 15 mg. 4. Continue wearing plantar fascia brace. 5. Appointment with Liliane Channel for custom molded orthotics.  6. Return to clinic in 8 weeks.   Edrick Kins, DPM [facility]  Dr. Edrick Kins, DPM    [address], [place] [address]                Office [phone]  Fax [phone]

## 2018-01-14 ENCOUNTER — Telehealth: Payer: Self-pay | Admitting: *Deleted

## 2018-01-14 MED ORDER — EXEMESTANE 25 MG PO TABS: 25.0000 mg | ORAL_TABLET | Freq: Every day | ORAL | 0 refills | Status: DC

## 2018-01-14 NOTE — Telephone Encounter (Signed)
Patient called to report that Anastrozole is causing hot flashes and she would like to be changed to something else. Please advise

## 2018-01-14 NOTE — Telephone Encounter (Signed)
She has already been on letrozole.  Please call in Aromasin.  Thanks!

## 2018-01-15 ENCOUNTER — Ambulatory Visit: Payer: Medicare Other | Admitting: Orthotics

## 2018-01-15 DIAGNOSIS — M722 Plantar fascial fibromatosis: Secondary | ICD-10-CM

## 2018-01-15 DIAGNOSIS — M79676 Pain in unspecified toe(s): Secondary | ICD-10-CM

## 2018-01-15 NOTE — Progress Notes (Signed)

## 2018-01-27 ENCOUNTER — Encounter: Payer: Self-pay | Admitting: *Deleted

## 2018-02-03 ENCOUNTER — Other Ambulatory Visit: Payer: Self-pay | Admitting: *Deleted

## 2018-02-03 MED ORDER — EXEMESTANE 25 MG PO TABS: 25.0000 mg | ORAL_TABLET | Freq: Every day | ORAL | 0 refills | Status: DC

## 2018-02-05 ENCOUNTER — Encounter: Payer: Medicare Other | Admitting: Orthotics

## 2018-02-05 ENCOUNTER — Ambulatory Visit: Payer: Medicare Other | Admitting: Orthotics

## 2018-02-05 DIAGNOSIS — M722 Plantar fascial fibromatosis: Secondary | ICD-10-CM

## 2018-02-06 NOTE — Progress Notes (Signed)
Patient came in today to pick up custom made foot orthotics.  The goals were accomplished and the patient reported no dissatisfaction with said orthotics.  Patient was advised of breakin period and how to report any issues. 

## 2018-02-19 ENCOUNTER — Encounter: Payer: Medicare Other | Admitting: Orthotics

## 2018-02-20 ENCOUNTER — Other Ambulatory Visit: Payer: Self-pay | Admitting: Internal Medicine

## 2018-02-20 DIAGNOSIS — R0989 Other specified symptoms and signs involving the circulatory and respiratory systems: Secondary | ICD-10-CM

## 2018-02-27 ENCOUNTER — Ambulatory Visit
Admission: RE | Admit: 2018-02-27 | Discharge: 2018-02-27 | Disposition: A | Discharge status: Home / Self Care | Payer: Medicare Other | Source: Ambulatory Visit | Attending: Internal Medicine | Admitting: Internal Medicine

## 2018-02-27 DIAGNOSIS — I6523 Occlusion and stenosis of bilateral carotid arteries: Secondary | ICD-10-CM | POA: Insufficient documentation

## 2018-02-27 DIAGNOSIS — R0689 Other abnormalities of breathing: Secondary | ICD-10-CM | POA: Insufficient documentation

## 2018-02-27 DIAGNOSIS — R0989 Other specified symptoms and signs involving the circulatory and respiratory systems: Secondary | ICD-10-CM

## 2018-03-17 ENCOUNTER — Ambulatory Visit: Payer: Medicare Other | Admitting: Radiation Oncology

## 2018-03-20 ENCOUNTER — Other Ambulatory Visit: Payer: Self-pay | Admitting: *Deleted

## 2018-03-20 MED ORDER — EXEMESTANE 25 MG PO TABS: 25.0000 mg | ORAL_TABLET | Freq: Every day | ORAL | 0 refills | Status: DC

## 2018-03-31 ENCOUNTER — Other Ambulatory Visit: Payer: Self-pay

## 2018-03-31 ENCOUNTER — Ambulatory Visit
Admission: RE | Admit: 2018-03-31 | Discharge: 2018-03-31 | Disposition: A | Discharge status: Home / Self Care | Payer: Medicare Other | Source: Ambulatory Visit | Attending: Radiation Oncology | Admitting: Radiation Oncology

## 2018-03-31 ENCOUNTER — Encounter: Payer: Self-pay | Admitting: Radiation Oncology

## 2018-03-31 VITALS — BP 157/70 | HR 76 | Temp 97.0°F | Resp 18 | Wt 156.5 lb

## 2018-03-31 DIAGNOSIS — C50011 Malignant neoplasm of nipple and areola, right female breast: Secondary | ICD-10-CM | POA: Diagnosis not present

## 2018-03-31 DIAGNOSIS — Z79811 Long term (current) use of aromatase inhibitors: Secondary | ICD-10-CM | POA: Diagnosis not present

## 2018-03-31 DIAGNOSIS — Z170 Estrogen receptor positive status [ER+]: Secondary | ICD-10-CM | POA: Diagnosis not present

## 2018-03-31 DIAGNOSIS — Z923 Personal history of irradiation: Secondary | ICD-10-CM | POA: Diagnosis not present

## 2018-03-31 NOTE — Progress Notes (Signed)
Radiation Oncology Follow up Note  Name: Katherine Gates   Date:   03/31/2018 MRN:  [id number] DOB: 07-27-1933    This 84 y.o. female presents to the clinic today for over 1-1/2 year follow-up status post whole breast radiation to her right breast for stage I ER/PR positive invasive mammary carcinoma.  REFERRING PROVIDER: Idelle Crouch, MD  HPI: patient is an 84 year old female now seen out over a year and a half having completed whole breast radiation to her right breast for stage I ER/PR positive invasive mammary carcinoma. Seen today in routine follow-up she is doing well. She specifically denies breast tenderness cough or bone pain..she is currently on Aromasin tolerating that well although she says it causes weight gain.she had mammograms back in general which I have reviewed BI-RADS 2 benign.  COMPLICATIONS OF TREATMENT: none  FOLLOW UP COMPLIANCE: keeps appointments   PHYSICAL EXAM:  BP (!) 157/70   Pulse 76   Temp (!) 97 F (36.1 C)   Resp 18   Wt 156 lb 8.4 oz (71 kg)   BMI 28.63 kg/m  Lungs are clear to A&P cardiac examination essentially unremarkable with regular rate and rhythm. No dominant mass or nodularity is noted in either breast in 2 positions examined. Incision is well-healed. No axillary or supraclavicular adenopathy is appreciated. Cosmetic result is excellent. Well-developed well-nourished patient in NAD. HEENT reveals PERLA, EOMI, discs not visualized.  Oral cavity is clear. No oral mucosal lesions are identified. Neck is clear without evidence of cervical or supraclavicular adenopathy. Lungs are clear to A&P. Cardiac examination is essentially unremarkable with regular rate and rhythm without murmur rub or thrill. Abdomen is benign with no organomegaly or masses noted. Motor sensory and DTR levels are equal and symmetric in the upper and lower extremities. Cranial nerves II through XII are grossly intact. Proprioception is intact. No peripheral adenopathy or edema  is identified. No motor or sensory levels are noted. Crude visual fields are within normal range.  RADIOLOGY RESULTS: mammograms are reviewed and compatible above-stated findings  PLAN: present time patient is doing well with no evidence of disease. I am please were overall progress. I've asked to see her back in 1 year for follow-up. She continues on Aromasin. Follow-up mammograms already been ordered. Patient is to call with any concerns.  I would like to take this opportunity to thank you for allowing me to participate in the care of your patient.Noreene Filbert, MD

## 2018-04-14 NOTE — Progress Notes (Signed)
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  INTERVAL HISTORY: Patient returns to clinic today for routine six-month evaluation.  She continues to tolerate anastrozole well only with occasional hot flashes.  She also has back pain with sciatica.  She otherwise feels well and is asymptomatic.  She has no neurologic complaints.  She denies any recent fevers or illnesses.  She has a good appetite and denies weight loss. She has no chest pain or shortness of breath. She denies any nausea, vomiting, constipation, or diarrhea. She has no urinary complaints.  Patient offers no further specific complaints today.    REVIEW OF SYSTEMS:   Review of Systems  Constitutional: Negative.  Negative for fever, malaise/fatigue and weight loss.  Respiratory: Negative.  Negative for sputum production.   Cardiovascular: Negative.  Negative for chest pain and leg swelling.  Gastrointestinal: Negative.  Negative for abdominal pain.  Genitourinary: Negative.   Musculoskeletal: Positive for back pain and joint pain.  Skin: Negative.  Negative for itching and rash.  Neurological: Positive for sensory change. Negative for focal weakness and weakness.  Psychiatric/Behavioral: Negative.  The patient is not nervous/anxious and does not have insomnia.     As per HPI. Otherwise, a complete review of systems is negative.  PAST MEDICAL HISTORY: Past Medical History:  Diagnosis Date  . Bell's palsy   . Breast cancer ([place]) 2017   right  . Dermatitis contact, eyelid   . Environmental allergies   . Fibrocystic breast disease    being followed at [facility]   . GERD (gastroesophageal reflux disease)   . IBS (irritable bowel  syndrome)   . Melanoma of skin ([place]) 2011   left upper arm  . Osteoarthritis   . Personal history of radiation therapy   . Rosacea     PAST SURGICAL HISTORY: Past Surgical History:  Procedure Laterality Date  . ABDOMINAL HYSTERECTOMY    . BREAST BIOPSY Right 02/29/2016   +  . BREAST EXCISIONAL BIOPSY Right 03/2016   + rad  . ENDOSCOPIC CARPAL TUNNEL RELEASE Right   . eyelid lift    . PARTIAL MASTECTOMY WITH NEEDLE LOCALIZATION Right 03/16/2016   Procedure: PARTIAL MASTECTOMY;  Surgeon: Leonie Green, MD;  Location: [facility] ORS;  Service: General;  Laterality: Right;  . repair tear ducts    . SENTINEL NODE BIOPSY Right 03/16/2016   Procedure: SENTINEL LYMPH NODE BIOPSY;  Surgeon: Leonie Green, MD;  Location: [facility] ORS;  Service: General;  Laterality: Right;    FAMILY HISTORY Family History  Problem Relation Age of Onset  . Skin cancer Brother        non-melanoma  . Skin cancer Father        non-melanoma  . Throat cancer Father   . Stroke Mother   . Breast cancer Neg Hx        ADVANCED DIRECTIVES:    HEALTH MAINTENANCE: Social History   Tobacco Use  . Smoking status: Never Smoker  . Smokeless tobacco: Never Used  Substance Use Topics  . Alcohol use: No    Alcohol/week: 0.0 oz  . Drug use: No     Colonoscopy:  PAP:  Bone density:  Lipid panel:  Allergies  Allergen Reactions  . Benzalkonium Chloride  Other (See Comments)    Positive patch test  . Garlic Diarrhea  . Methylisothiazolinone Other (See Comments)    Positive patch test Positive patch test    Current Outpatient Medications  Medication Sig Dispense Refill  . aspirin EC 81 MG tablet Take 81 mg by mouth daily.     . Cholecalciferol (VITAMIN D3) 1000 units CAPS Take by mouth daily.    Marland Kitchen exemestane (AROMASIN) 25 MG tablet Take 1 tablet (25 mg total) by mouth daily after breakfast. 90 tablet 0  . fexofenadine (ALLEGRA) 180 MG tablet Take by mouth.    . fluticasone (FLONASE) 50 MCG/ACT  nasal spray Place 1 spray into the nose daily.     Marland Kitchen gabapentin (NEURONTIN) 300 MG capsule 1 cap PO QHS x 1 week. After, go up to 2 caps PO QHS for neuropathic-mediated pain. Can go up to 3 caps PO QHS if tolerated.    Marland Kitchen gentamicin ointment (GARAMYCIN) 0.1 %     . meloxicam (MOBIC) 15 MG tablet TAKE 1 TABLET BY MOUTH DAILY 30 tablet 0  . metroNIDAZOLE (METROGEL) 0.75 % gel Apply 1 application topically 2 (two) times daily.     Marland Kitchen Optical Supplies (OCUSOFT DRY EYE) KIT by Does not apply route.    . pantoprazole (PROTONIX) 40 MG tablet Take by mouth.    . Propylene Glycol (SYSTANE BALANCE OP) Apply 1 drop to eye 2 (two) times daily.    . naproxen sodium (ANAPROX) 220 MG tablet Take 220 mg by mouth 2 (two) times daily with a meal.     No current facility-administered medications for this visit.     OBJECTIVE: Vitals:   04/16/18 1405  BP: (!) 167/85  Pulse: 67  Resp: 18  Temp: (!) 95.7 F (35.4 C)     Body mass index is 28.59 kg/m.    ECOG FS:0 - Asymptomatic  General: Well-developed, well-nourished, no acute distress. Eyes: Pink conjunctiva, anicteric sclera. Breast: Bilateral breast and axilla without lumps or masses. Lungs: Clear to auscultation bilaterally. Heart: Regular rate and rhythm. No rubs, murmurs, or gallops. Abdomen: Soft, nontender, nondistended. No organomegaly noted, normoactive bowel sounds. Musculoskeletal: No edema, cyanosis, or clubbing. Neuro: Alert, answering all questions appropriately. Cranial nerves grossly intact. Skin: No rashes or petechiae noted. Psych: Normal affect.   LAB RESULTS:  Lab Results  Component Value Date   NA 139 03/08/2016   K 3.9 03/08/2016   CL 105 03/08/2016   CO2 26 03/08/2016   GLUCOSE 104 (H) 03/08/2016   BUN 21 (H) 03/08/2016   CREATININE 0.94 03/08/2016   CALCIUM 9.3 03/08/2016   PROT 7.0 03/08/2016   ALBUMIN 3.8 03/08/2016   AST 59 (H) 03/08/2016   ALT 41 03/08/2016   ALKPHOS 74 03/08/2016   BILITOT 0.5 03/08/2016    GFRNONAA 55 (L) 03/08/2016   GFRAA >60 03/08/2016    Lab Results  Component Value Date   WBC 6.4 07/11/2016   NEUTROABS 5.3 03/08/2016   HGB 13.8 07/11/2016   HCT 39.2 07/11/2016   MCV 92.4 07/11/2016   PLT 223 07/11/2016      STUDIES: No results found.  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 Mammoprint low risk.  PLAN:    1. 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 Patient completed adjuvant XRT in August 2017.  Despite  hot flashes, patient will continue anastrozole for a total of 5 years completing in August 2022.  Her most recent mammogram on December 20, 2017 was reported as BI-RADS 2.  Return to clinic in 6 months for further evaluation.   2. Postmenopausal: Baseline bone mineral density completed on August 02, 2016 revealed a T score of -0.5 which is considered normal.  Repeat bone marrow density in August 2019.  Approximately 20 minutes was spent in discussion of which greater than 50% was consultation.  Patient expressed understanding and was in agreement with this plan. She also understands that She can call clinic at any time with any questions, concerns, or complaints.    Lloyd Huger, MD   04/20/2018 3:39 PM

## 2018-04-15 ENCOUNTER — Other Ambulatory Visit: Payer: Self-pay | Admitting: Podiatry

## 2018-04-16 ENCOUNTER — Other Ambulatory Visit: Payer: Self-pay

## 2018-04-16 ENCOUNTER — Inpatient Hospital Stay: Payer: Medicare Other | Attending: Oncology | Admitting: Oncology

## 2018-04-16 VITALS — BP 167/85 | HR 67 | Temp 95.7°F | Resp 18 | Wt 156.3 lb

## 2018-04-16 DIAGNOSIS — K589 Irritable bowel syndrome without diarrhea: Secondary | ICD-10-CM | POA: Insufficient documentation

## 2018-04-16 DIAGNOSIS — Z800 Family history of malignant neoplasm of digestive organs: Secondary | ICD-10-CM | POA: Diagnosis not present

## 2018-04-16 DIAGNOSIS — Z780 Asymptomatic menopausal state: Secondary | ICD-10-CM | POA: Diagnosis not present

## 2018-04-16 DIAGNOSIS — C50111 Malignant neoplasm of central portion of right female breast: Secondary | ICD-10-CM | POA: Diagnosis present

## 2018-04-16 DIAGNOSIS — R232 Flushing: Secondary | ICD-10-CM | POA: Diagnosis not present

## 2018-04-16 DIAGNOSIS — Z79899 Other long term (current) drug therapy: Secondary | ICD-10-CM

## 2018-04-16 DIAGNOSIS — Z79811 Long term (current) use of aromatase inhibitors: Secondary | ICD-10-CM

## 2018-04-16 DIAGNOSIS — M5430 Sciatica, unspecified side: Secondary | ICD-10-CM | POA: Diagnosis not present

## 2018-04-16 DIAGNOSIS — C170 Malignant neoplasm of duodenum: Secondary | ICD-10-CM | POA: Insufficient documentation

## 2018-04-16 DIAGNOSIS — Z170 Estrogen receptor positive status [ER+]: Secondary | ICD-10-CM

## 2018-04-16 DIAGNOSIS — Z85820 Personal history of malignant melanoma of skin: Secondary | ICD-10-CM | POA: Insufficient documentation

## 2018-04-16 DIAGNOSIS — Z7982 Long term (current) use of aspirin: Secondary | ICD-10-CM | POA: Insufficient documentation

## 2018-04-16 DIAGNOSIS — M549 Dorsalgia, unspecified: Secondary | ICD-10-CM | POA: Diagnosis not present

## 2018-04-16 DIAGNOSIS — Z923 Personal history of irradiation: Secondary | ICD-10-CM | POA: Insufficient documentation

## 2018-04-16 DIAGNOSIS — M1990 Unspecified osteoarthritis, unspecified site: Secondary | ICD-10-CM | POA: Insufficient documentation

## 2018-04-16 DIAGNOSIS — K219 Gastro-esophageal reflux disease without esophagitis: Secondary | ICD-10-CM | POA: Diagnosis not present

## 2018-04-16 NOTE — Progress Notes (Signed)
Here for follow up. Doing well except lower back pain w sciatica to R leg/foot. Starting PT

## 2018-05-27 ENCOUNTER — Other Ambulatory Visit: Payer: Self-pay | Admitting: Oncology

## 2018-05-28 ENCOUNTER — Other Ambulatory Visit: Payer: Self-pay | Admitting: Physician Assistant

## 2018-05-28 DIAGNOSIS — M5136 Other intervertebral disc degeneration, lumbar region: Secondary | ICD-10-CM

## 2018-05-28 DIAGNOSIS — M4317 Spondylolisthesis, lumbosacral region: Secondary | ICD-10-CM

## 2018-05-28 DIAGNOSIS — M5416 Radiculopathy, lumbar region: Secondary | ICD-10-CM

## 2018-06-10 ENCOUNTER — Ambulatory Visit
Admission: RE | Admit: 2018-06-10 | Discharge: 2018-06-10 | Disposition: A | Discharge status: Home / Self Care | Payer: Medicare Other | Source: Ambulatory Visit | Attending: Physician Assistant | Admitting: Physician Assistant

## 2018-06-10 DIAGNOSIS — M48061 Spinal stenosis, lumbar region without neurogenic claudication: Secondary | ICD-10-CM | POA: Diagnosis not present

## 2018-06-10 DIAGNOSIS — M4317 Spondylolisthesis, lumbosacral region: Secondary | ICD-10-CM

## 2018-06-10 DIAGNOSIS — M5416 Radiculopathy, lumbar region: Secondary | ICD-10-CM

## 2018-06-10 DIAGNOSIS — M5116 Intervertebral disc disorders with radiculopathy, lumbar region: Secondary | ICD-10-CM | POA: Diagnosis present

## 2018-06-10 DIAGNOSIS — M7138 Other bursal cyst, other site: Secondary | ICD-10-CM | POA: Insufficient documentation

## 2018-06-10 DIAGNOSIS — M47817 Spondylosis without myelopathy or radiculopathy, lumbosacral region: Secondary | ICD-10-CM | POA: Insufficient documentation

## 2018-06-10 DIAGNOSIS — M5136 Other intervertebral disc degeneration, lumbar region: Secondary | ICD-10-CM

## 2018-06-10 DIAGNOSIS — M51369 Other intervertebral disc degeneration, lumbar region without mention of lumbar back pain or lower extremity pain: Secondary | ICD-10-CM

## 2018-08-04 ENCOUNTER — Ambulatory Visit
Admission: RE | Admit: 2018-08-04 | Discharge: 2018-08-04 | Disposition: A | Discharge status: Home / Self Care | Payer: Medicare Other | Source: Ambulatory Visit | Attending: Oncology | Admitting: Oncology

## 2018-08-04 DIAGNOSIS — M85832 Other specified disorders of bone density and structure, left forearm: Secondary | ICD-10-CM | POA: Diagnosis not present

## 2018-08-04 DIAGNOSIS — Z13820 Encounter for screening for osteoporosis: Secondary | ICD-10-CM | POA: Insufficient documentation

## 2018-08-04 DIAGNOSIS — C50111 Malignant neoplasm of central portion of right female breast: Secondary | ICD-10-CM

## 2018-08-04 DIAGNOSIS — Z853 Personal history of malignant neoplasm of breast: Secondary | ICD-10-CM | POA: Diagnosis not present

## 2018-08-04 DIAGNOSIS — Z780 Asymptomatic menopausal state: Secondary | ICD-10-CM | POA: Insufficient documentation

## 2018-08-04 DIAGNOSIS — Z79811 Long term (current) use of aromatase inhibitors: Secondary | ICD-10-CM | POA: Insufficient documentation

## 2018-08-04 DIAGNOSIS — Z170 Estrogen receptor positive status [ER+]: Secondary | ICD-10-CM | POA: Insufficient documentation

## 2018-08-26 ENCOUNTER — Telehealth: Payer: Self-pay | Admitting: *Deleted

## 2018-08-26 NOTE — Telephone Encounter (Signed)
Patient called reporting that she is havign horrible hot flashes and she wants a new cancer pill. She states that she has started taking Gabapentin and Celebrex for her back and is unsure if that is the cause, but she would like to try a different cancer pill. Please advise

## 2018-08-26 NOTE — Telephone Encounter (Signed)
Call returned to patient doctor response discussed and she will call me back in 4 weeks to let me know how she is feeling

## 2018-08-26 NOTE — Telephone Encounter (Signed)
It looks like she has been on anastrozole since 2017.  Unlikely the cause of her hot flashes. If she want to try, hold anastrozole for 4 weeks.  If symptoms improve, we can call in letrozole.  If not, she can go back on the anastrozole.   thanks.

## 2018-10-19 NOTE — Progress Notes (Signed)
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  INTERVAL HISTORY: Patient returns to clinic today for routine six-month evaluation.  She has discontinued Aromasin with improvement of her hot flashes.  She continues to have significant back pain with sciatica and reports she may have to undergo surgery in the next several months.  She otherwise feels well.  She has no other neurologic complaints. She denies any recent fevers or illnesses.  She has a good appetite and denies weight loss. She has no chest pain or shortness of breath. She denies any nausea, vomiting, constipation, or diarrhea. She has no urinary complaints.  Patient offers no further specific complaints today.  REVIEW OF SYSTEMS:   Review of Systems  Constitutional: Negative.  Negative for fever, malaise/fatigue and weight loss.  Respiratory: Negative.  Negative for cough and shortness of breath.   Cardiovascular: Negative.  Negative for chest pain and leg swelling.  Gastrointestinal: Negative.  Negative for abdominal pain.  Genitourinary: Negative.  Negative for dysuria.  Musculoskeletal: Positive for back pain and joint pain.  Skin: Negative.  Negative for itching and rash.  Neurological: Negative.  Negative for sensory change, focal weakness and weakness.  Psychiatric/Behavioral: Negative.  The patient is not nervous/anxious and does not have insomnia.     As per HPI. Otherwise, a complete review of systems is negative.  PAST MEDICAL HISTORY: Past Medical History:  Diagnosis Date  . Bell's palsy   . Breast cancer ([place]) 2017   right  . Dermatitis contact, eyelid   . Environmental allergies   . Fibrocystic breast disease    being followed at [facility]   . GERD (gastroesophageal reflux disease)   . IBS (irritable bowel syndrome)   . Melanoma of skin ([place]) 2011   left upper arm  . Osteoarthritis   . Personal history of radiation therapy   . Rosacea     PAST SURGICAL HISTORY: Past Surgical History:  Procedure Laterality Date  . ABDOMINAL HYSTERECTOMY    . BREAST BIOPSY Right 02/29/2016   +  . BREAST EXCISIONAL BIOPSY Right 03/2016   + rad  . ENDOSCOPIC CARPAL TUNNEL RELEASE Right   . eyelid lift    . PARTIAL MASTECTOMY WITH NEEDLE LOCALIZATION Right 03/16/2016   Procedure: PARTIAL MASTECTOMY;  Surgeon: Leonie Green, MD;  Location: [facility] ORS;  Service: General;  Laterality: Right;  . repair tear ducts    . SENTINEL NODE BIOPSY Right 03/16/2016   Procedure: SENTINEL LYMPH NODE BIOPSY;  Surgeon: Leonie Green, MD;  Location: [facility] ORS;  Service: General;  Laterality: Right;    FAMILY HISTORY Family History  Problem Relation Age of Onset  . Skin cancer Brother        non-melanoma  . Skin cancer Father        non-melanoma  . Throat cancer Father   . Stroke Mother   . Breast cancer Neg Hx        ADVANCED DIRECTIVES:    HEALTH MAINTENANCE: Social History   Tobacco Use  . Smoking status: Never Smoker  . Smokeless tobacco: Never Used  Substance Use Topics  . Alcohol use: No    Alcohol/week: 0.0 standard drinks  . Drug use: No     Colonoscopy:  PAP:  Bone density:  Lipid panel:  Allergies  Allergen Reactions  . Benzalkonium Chloride Other (See Comments)    Positive patch test  . Garlic Diarrhea  . Methylisothiazolinone Other (See Comments)    Positive patch test Positive patch test    Current Outpatient Medications  Medication Sig Dispense Refill  . aspirin EC 81 MG tablet Take 81 mg by mouth daily.     . Cholecalciferol (VITAMIN D3) 1000 units CAPS Take by mouth daily.    . fexofenadine (ALLEGRA) 180 MG tablet Take by mouth.    . fluticasone (FLONASE) 50 MCG/ACT nasal  spray Place 1 spray into the nose daily.     Marland Kitchen gabapentin (NEURONTIN) 300 MG capsule 1 cap PO QHS x 1 week. After, go up to 2 caps PO QHS for neuropathic-mediated pain. Can go up to 3 caps PO QHS if tolerated.    Marland Kitchen gentamicin ointment (GARAMYCIN) 0.1 %     . metroNIDAZOLE (METROGEL) 0.75 % gel Apply 1 application topically 2 (two) times daily.     Marland Kitchen Optical Supplies (OCUSOFT DRY EYE) KIT by Does not apply route.    . pantoprazole (PROTONIX) 40 MG tablet Take by mouth.    . Propylene Glycol (SYSTANE BALANCE OP) Apply 1 drop to eye 2 (two) times daily.     No current facility-administered medications for this visit.     OBJECTIVE: Vitals:   10/20/18 1424  BP: (!) 151/79  Pulse: 78  Resp: 18  Temp: 97.8 F (36.6 C)     There is no height or weight on file to calculate BMI.    ECOG FS:0 - Asymptomatic  General: Well-developed, well-nourished, no acute distress. Eyes: Pink conjunctiva, anicteric sclera. HEENT: Normocephalic, moist mucous membranes. Breast: Patient declined breast exam today. Lungs: Clear to auscultation bilaterally. Heart: Regular rate and rhythm. No rubs, murmurs, or gallops. Abdomen: Soft, nontender, nondistended. No organomegaly noted, normoactive bowel sounds. Musculoskeletal: No edema, cyanosis, or clubbing. Neuro: Alert, answering all questions appropriately. Cranial nerves grossly intact. Skin: No rashes or petechiae noted. Psych: Normal affect.  LAB RESULTS:  Lab Results  Component Value Date   NA 139 03/08/2016   K 3.9 03/08/2016   CL 105 03/08/2016   CO2 26 03/08/2016   GLUCOSE 104 (H) 03/08/2016   BUN 21 (H) 03/08/2016   CREATININE 0.94 03/08/2016   CALCIUM 9.3 03/08/2016   PROT 7.0 03/08/2016   ALBUMIN 3.8 03/08/2016   AST 59 (H) 03/08/2016   ALT 41 03/08/2016   ALKPHOS 74 03/08/2016   BILITOT 0.5 03/08/2016   GFRNONAA 55 (L) 03/08/2016   GFRAA >60 03/08/2016    Lab Results  Component Value Date   WBC 6.4 07/11/2016   NEUTROABS 5.3  03/08/2016   HGB 13.8 07/11/2016   HCT 39.2 07/11/2016   MCV 92.4 07/11/2016   PLT 223 07/11/2016      STUDIES: No results found.  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 Mammoprint low risk.  PLAN:    1. 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 Patient completed adjuvant XRT in August 2017.  Patient previously took letrozole which was discontinued secondary to side effects.  She has now discontinued Aromasin secondary to persistent hot flashes.  She does not wish to try another treatment at this time.  Patient expressed understanding that her risk  of recurrence may increase by not completing 5 years of treatment.  No further intervention is needed at this time.  Her most recent mammogram on December 20, 2017 was reported as BI-RADS 2.  Repeat in January 2020.  Return to clinic in 6 months for routine evaluation.     2.  Osteopenia: Bone marrow density completed on August 04, 2018 reported T score of -1.3.  This is slightly worse than 2 years prior when her T score was normal at -0.5.  Have recommended patient take calcium and vitamin D supplementation.  Now the patient is no longer taking aromatase inhibitors, her bone health can be monitored by primary care. 3.  Back pain: Patient reports possible surgery in the next several months.  Continue evaluation and treatment per orthopedics.     Patient expressed understanding and was in agreement with this plan. She also understands that She can call clinic at any time with any questions, concerns, or complaints.    Lloyd Huger, MD   10/21/2018 11:10 AM

## 2018-10-20 ENCOUNTER — Other Ambulatory Visit: Payer: Self-pay

## 2018-10-20 ENCOUNTER — Inpatient Hospital Stay: Payer: Medicare Other | Attending: Oncology | Admitting: Oncology

## 2018-10-20 VITALS — BP 151/79 | HR 78 | Temp 97.8°F | Resp 18

## 2018-10-20 DIAGNOSIS — Z85820 Personal history of malignant melanoma of skin: Secondary | ICD-10-CM | POA: Insufficient documentation

## 2018-10-20 DIAGNOSIS — K219 Gastro-esophageal reflux disease without esophagitis: Secondary | ICD-10-CM | POA: Diagnosis not present

## 2018-10-20 DIAGNOSIS — C50111 Malignant neoplasm of central portion of right female breast: Secondary | ICD-10-CM | POA: Diagnosis present

## 2018-10-20 DIAGNOSIS — Z7982 Long term (current) use of aspirin: Secondary | ICD-10-CM | POA: Diagnosis not present

## 2018-10-20 DIAGNOSIS — M8580 Other specified disorders of bone density and structure, unspecified site: Secondary | ICD-10-CM | POA: Insufficient documentation

## 2018-10-20 DIAGNOSIS — Z923 Personal history of irradiation: Secondary | ICD-10-CM | POA: Diagnosis not present

## 2018-10-20 DIAGNOSIS — K589 Irritable bowel syndrome without diarrhea: Secondary | ICD-10-CM | POA: Insufficient documentation

## 2018-10-20 DIAGNOSIS — M549 Dorsalgia, unspecified: Secondary | ICD-10-CM | POA: Diagnosis not present

## 2018-10-20 DIAGNOSIS — M1990 Unspecified osteoarthritis, unspecified site: Secondary | ICD-10-CM | POA: Diagnosis not present

## 2018-10-20 DIAGNOSIS — Z79811 Long term (current) use of aromatase inhibitors: Secondary | ICD-10-CM | POA: Diagnosis not present

## 2018-10-20 DIAGNOSIS — Z170 Estrogen receptor positive status [ER+]: Secondary | ICD-10-CM | POA: Insufficient documentation

## 2018-10-20 DIAGNOSIS — Z79899 Other long term (current) drug therapy: Secondary | ICD-10-CM

## 2018-10-20 DIAGNOSIS — R232 Flushing: Secondary | ICD-10-CM

## 2018-10-20 DIAGNOSIS — M5430 Sciatica, unspecified side: Secondary | ICD-10-CM | POA: Diagnosis not present

## 2018-10-20 NOTE — Progress Notes (Signed)
Patient here today for follow up regarding breast cancer. Patient has been off aromasin for about 6 weeks now, reports hot flashes are improved since stopping medication.

## 2018-11-28 ENCOUNTER — Ambulatory Visit: Payer: Medicare Other

## 2018-12-25 ENCOUNTER — Other Ambulatory Visit: Payer: Self-pay | Admitting: Neurological Surgery

## 2018-12-29 ENCOUNTER — Ambulatory Visit
Admission: RE | Admit: 2018-12-29 | Discharge: 2018-12-29 | Disposition: A | Discharge status: Home / Self Care | Payer: Medicare Other | Source: Ambulatory Visit | Attending: Oncology | Admitting: Oncology

## 2018-12-29 DIAGNOSIS — C50111 Malignant neoplasm of central portion of right female breast: Secondary | ICD-10-CM | POA: Diagnosis present

## 2018-12-29 DIAGNOSIS — Z170 Estrogen receptor positive status [ER+]: Secondary | ICD-10-CM | POA: Insufficient documentation

## 2019-01-02 NOTE — Pre-Procedure Instructions (Addendum)
Katherine Gates  01/02/2019      [place], [place] [address] Phone: [phone] Fax: [place], [place] [address] [address] Phone: [phone] Fax: [phone]  [facility] [place], [place] [address] [place] [address] Phone: [phone] Fax: [phone]  [place], [place] [address] [place] [address] Phone: [phone] Fax: [phone]    Your procedure is scheduled on January 12, 2019.  Report to [facility] Admitting at 930 AM.  Call this number if you have problems the morning of surgery:  [phone]   Remember:  Do not eat or drink after midnight.    Take these medicines the morning of surgery with A SIP OF WATER  fexofenadine (ALLEGRA) fluticasone (FLONASE) 50 MCG/ACT nasal spray gabapentin (NEURONTIN)  pantoprazole (PROTONIX)  Propylene Glycol (SYSTANE BALANCE OP)-if needed  Follow your surgeon's instructions on when to hold/resume aspirin.  If no instructions were given call the office to determine how they would like to you take aspirin  7 days prior to surgery STOP taking any Celebrex, Aleve, Naproxen, Ibuprofen, Motrin, Advil, Goody's, BC's, all herbal medications, fish oil, and all vitamins   Do not wear jewelry, make-up or nail polish.  Do not wear lotions, powders, or perfumes, or deodorant.  Do not shave 48 hours prior to surgery.    Do not bring valuables to the hospital.  [facility] is not responsible for any belongings or valuables.  Contacts, dentures or bridgework may not be worn into surgery.  Leave your suitcase in the car.  After surgery it may be brought to your room.  For patients admitted to the hospital, discharge time will be determined by your treatment team.  Patients  discharged the day of surgery will not be allowed to drive home.    [place]- Preparing For Surgery  Before surgery, you can play an important role. Because skin is not sterile, your skin needs to be as free of germs as possible. You can reduce the number of germs on your skin by washing with CHG (chlorahexidine gluconate) Soap before surgery.  CHG is an antiseptic cleaner which kills germs and bonds with the skin to continue killing germs even after washing.    Oral Hygiene is also important to reduce your risk of infection.  Remember - BRUSH YOUR TEETH THE MORNING OF SURGERY WITH YOUR REGULAR TOOTHPASTE  Please do not use if you have an allergy to CHG or antibacterial soaps. If your skin becomes reddened/irritated stop using the CHG.  Do not shave (including legs and underarms) for at least 48 hours prior to first CHG shower. It is OK to shave your face.  Please follow these instructions carefully.   1. Shower the NIGHT BEFORE SURGERY and the MORNING OF SURGERY with CHG.   2. If you chose to wash your hair, wash your hair first as usual with your normal shampoo.  3. After you shampoo, rinse your hair and body thoroughly to remove the shampoo.  4. Use CHG as you would any other liquid soap. You can apply CHG directly to the skin and wash gently with a scrungie or a clean washcloth.   5. Apply the CHG Soap to your body ONLY FROM THE  NECK DOWN.  Do not use on open wounds or open sores. Avoid contact with your eyes, ears, mouth and genitals (private parts). Wash Face and genitals (private parts)  with your normal soap.  6. Wash thoroughly, paying special attention to the area where your surgery will be performed.  7. Thoroughly rinse your body with warm water from the neck down.  8. DO NOT shower/wash with your normal soap after using and rinsing off the CHG Soap.  9. Pat yourself dry with a CLEAN TOWEL.  10. Wear CLEAN PAJAMAS to bed the night before surgery, wear comfortable  clothes the morning of surgery  11. Place CLEAN SHEETS on your bed the night of your first shower and DO NOT SLEEP WITH PETS.  Day of Surgery:  Do not apply any deodorants/lotions.  Please wear clean clothes to the hospital/surgery center.   Remember to brush your teeth WITH YOUR REGULAR TOOTHPASTE.   Please read over the following fact sheets that you were given.

## 2019-01-05 ENCOUNTER — Encounter (HOSPITAL_COMMUNITY): Payer: Self-pay

## 2019-01-05 ENCOUNTER — Encounter (HOSPITAL_COMMUNITY)
Admission: RE | Admit: 2019-01-05 | Discharge: 2019-01-05 | Disposition: A | Discharge status: Home / Self Care | Payer: Medicare Other | Source: Ambulatory Visit | Attending: Neurological Surgery | Admitting: Neurological Surgery

## 2019-01-05 ENCOUNTER — Other Ambulatory Visit: Payer: Self-pay

## 2019-01-05 DIAGNOSIS — Z01818 Encounter for other preprocedural examination: Secondary | ICD-10-CM | POA: Insufficient documentation

## 2019-01-05 HISTORY — DX: Polyneuropathy, unspecified: G62.9

## 2019-01-05 HISTORY — DX: Unspecified hearing loss, unspecified ear: H91.90

## 2019-01-05 LAB — BASIC METABOLIC PANEL
Anion gap: 11 (ref 5–15)
BUN: 20 mg/dL (ref 8–23)
CO2: 22 mmol/L (ref 22–32)
Calcium: 9.1 mg/dL (ref 8.9–10.3)
Chloride: 105 mmol/L (ref 98–111)
Creatinine, Ser: 1.05 mg/dL — ABNORMAL HIGH (ref 0.44–1.00)
GFR calc Af Amer: 57 mL/min — ABNORMAL LOW (ref >60)
GFR calc non Af Amer: 49 mL/min — ABNORMAL LOW (ref >60)
Glucose, Bld: 98 mg/dL (ref 70–99)
POTASSIUM: 4.4 mmol/L (ref 3.5–5.1)
Sodium: 138 mmol/L (ref 135–145)

## 2019-01-05 LAB — SURGICAL PCR SCREEN
MRSA, PCR: NEGATIVE
Staphylococcus aureus: NEGATIVE

## 2019-01-05 LAB — TYPE AND SCREEN
ABO/RH(D): O POS
ANTIBODY SCREEN: NEGATIVE

## 2019-01-05 LAB — CBC
HCT: 45 % (ref 36.0–46.0)
Hemoglobin: 14.1 g/dL (ref 12.0–15.0)
MCH: 29.6 pg (ref 26.0–34.0)
MCHC: 31.3 g/dL (ref 30.0–36.0)
MCV: 94.5 fL (ref 80.0–100.0)
Platelets: 241 10*3/uL (ref 150–400)
RBC: 4.76 MIL/uL (ref 3.87–5.11)
RDW: 13.2 % (ref 11.5–15.5)
WBC: 8.8 10*3/uL (ref 4.0–10.5)
nRBC: 0 % (ref 0.0–0.2)

## 2019-01-05 LAB — ABO/RH: ABO/RH(D): O POS

## 2019-01-05 NOTE — Pre-Procedure Instructions (Signed)
Nusaiba Guallpa Simonin  01/05/2019      [place], [place] [address] Phone: [phone] Fax: [place], [place] [address] [address] Phone: [phone] Fax: [phone]  [facility] [place], [place] [address] [place] [address] Phone: [phone] Fax: [phone]  [place], [place] [address] Phone: [phone] Fax: [phone]    Your procedure is scheduled on Monday,  January 12, 2019.    Report to [facility] Admitting at 930 AM.             (posted surgery time 11:30a - 3:48p)   Call this number if you have problems the morning of surgery:  [phone]   Remember:  Do not eat any foods or drink any liquids after midnight, Sunday.    Take these medicines the morning of surgery with A SIP OF WATER  fexofenadine (ALLEGRA) fluticasone (FLONASE) 50 MCG/ACT nasal spray gabapentin (NEURONTIN)  pantoprazole (PROTONIX)  Propylene Glycol (SYSTANE BALANCE OP)-if needed  Follow your surgeon's instructions on when to hold/resume aspirin.  If no instructions were given call the office to determine how they would like to you take aspirin  7 days prior to surgery STOP taking any Celebrex, Aleve, Naproxen, Ibuprofen, Motrin, Advil, Goody's, BC's, all herbal medications, fish oil, and all vitamins   Do not wear jewelry, make-up or nail polish.  Do not wear lotions, powders,  perfumes, or deodorant.  Do not shave 48 hours prior to surgery.    Do not bring valuables to the hospital.  [facility] is not responsible for any belongings or valuables.  Contacts, dentures or bridgework may not be worn into surgery.  Leave your suitcase in the car.  After surgery it may be brought to your room.  For patients  admitted to the hospital, discharge time will be determined by your treatment team.       [facility]- Preparing For Surgery  Before surgery, you can play an important role. Because skin is not sterile, your skin needs to be as free of germs as possible. You can reduce the number of germs on your skin by washing with CHG (chlorahexidine gluconate) Soap before surgery.  CHG is an antiseptic cleaner which kills germs and bonds with the skin to continue killing germs even after washing.    Oral Hygiene is also important to reduce your risk of infection.    Remember - BRUSH YOUR TEETH THE MORNING OF SURGERY WITH YOUR REGULAR TOOTHPASTE  Please do not use if you have an allergy to CHG or antibacterial soaps. If your skin becomes reddened/irritated stop using the CHG.  Do not shave (including legs and underarms) for at least 48 hours prior to first CHG shower. It is OK to shave your face.  Please follow these instructions carefully.   1. Shower the NIGHT BEFORE SURGERY and the MORNING OF SURGERY with CHG.   2. If you chose to wash your hair, wash your hair first as usual with your normal shampoo.  3. After you shampoo, rinse your hair and body thoroughly to remove the shampoo.  4. Use CHG as you would any other liquid soap. You can apply CHG directly to the skin and wash gently with  a scrungie or a clean washcloth.   5. Apply the CHG Soap to your body ONLY FROM THE NECK DOWN.  Do not use on open wounds or open sores. Avoid contact with your eyes, ears, mouth and genitals (private parts). Wash Face and genitals (private parts)  with your normal soap.  6. Wash thoroughly, paying special attention to the area where your surgery will be performed.  7. Thoroughly rinse your body with warm water from the neck down.  8. DO NOT shower/wash with your normal soap after using and rinsing off the CHG Soap.  9. Pat yourself dry with a CLEAN TOWEL.  10. Wear CLEAN PAJAMAS to bed the night before  surgery, wear comfortable clothes the morning of surgery  11. Place CLEAN SHEETS on your bed the night of your first shower and DO NOT SLEEP WITH PETS.  Day of Surgery:  Do not apply any deodorants/lotions.  Please wear clean clothes to the hospital/surgery center.    Remember to brush your teeth WITH YOUR REGULAR TOOTHPASTE.   Please read over the following fact sheets that you were given.

## 2019-01-05 NOTE — Progress Notes (Signed)
PCP -  Dr. Doy Hutching, [facility]  [place]  12/2018 Cardiologist - N/A  Chest x-ray - N/A  EKG - 01/05/2019  Stress Test - n/a ECHO - n/a Cardiac Cath - n/a  Sleep Study - n/a CPAP - n/a  Fasting Blood Sugar - n/a Checks Blood Sugar _____ times a day  Blood Thinner Instructions:n/a Aspirin Instructions: stopped her aspirin a couple of days ago.  Anesthesia review: no  Patient denies shortness of breath, fever, cough and chest pain at PAT appointment   Patient verbalized understanding of instructions that were given to them at the PAT appointment. Patient was also instructed that they will need to review over the PAT instructions again at home before surgery.

## 2019-01-12 ENCOUNTER — Inpatient Hospital Stay (HOSPITAL_COMMUNITY): Payer: Medicare Other

## 2019-01-12 ENCOUNTER — Inpatient Hospital Stay (HOSPITAL_COMMUNITY)
Admission: RE | Admit: 2019-01-12 | Discharge: 2019-01-15 | DRG: 454 | Disposition: A | Discharge status: Home / Self Care | Payer: Medicare Other | Attending: Neurological Surgery | Admitting: Neurological Surgery

## 2019-01-12 ENCOUNTER — Inpatient Hospital Stay (HOSPITAL_COMMUNITY)
Admission: RE | Disposition: A | Discharge status: Home / Self Care | Payer: Self-pay | Source: Home / Self Care | Attending: Neurological Surgery

## 2019-01-12 ENCOUNTER — Encounter (HOSPITAL_COMMUNITY): Payer: Self-pay | Admitting: *Deleted

## 2019-01-12 DIAGNOSIS — Z853 Personal history of malignant neoplasm of breast: Secondary | ICD-10-CM

## 2019-01-12 DIAGNOSIS — M5116 Intervertebral disc disorders with radiculopathy, lumbar region: Secondary | ICD-10-CM | POA: Diagnosis present

## 2019-01-12 DIAGNOSIS — N390 Urinary tract infection, site not specified: Secondary | ICD-10-CM | POA: Diagnosis present

## 2019-01-12 DIAGNOSIS — M4156 Other secondary scoliosis, lumbar region: Secondary | ICD-10-CM | POA: Diagnosis present

## 2019-01-12 DIAGNOSIS — N179 Acute kidney failure, unspecified: Secondary | ICD-10-CM | POA: Diagnosis present

## 2019-01-12 DIAGNOSIS — M48062 Spinal stenosis, lumbar region with neurogenic claudication: Secondary | ICD-10-CM | POA: Diagnosis present

## 2019-01-12 DIAGNOSIS — Z419 Encounter for procedure for purposes other than remedying health state, unspecified: Secondary | ICD-10-CM

## 2019-01-12 DIAGNOSIS — G629 Polyneuropathy, unspecified: Secondary | ICD-10-CM | POA: Diagnosis present

## 2019-01-12 DIAGNOSIS — M4316 Spondylolisthesis, lumbar region: Principal | ICD-10-CM

## 2019-01-12 DIAGNOSIS — Z885 Allergy status to narcotic agent status: Secondary | ICD-10-CM | POA: Diagnosis not present

## 2019-01-12 DIAGNOSIS — M1990 Unspecified osteoarthritis, unspecified site: Secondary | ICD-10-CM | POA: Diagnosis present

## 2019-01-12 DIAGNOSIS — K219 Gastro-esophageal reflux disease without esophagitis: Secondary | ICD-10-CM | POA: Diagnosis present

## 2019-01-12 HISTORY — PX: BACK SURGERY: SHX140

## 2019-01-12 HISTORY — PX: LUMBAR FUSION: SHX111

## 2019-01-12 SURGERY — POSTERIOR LUMBAR FUSION 2 LEVEL
Anesthesia: General | Site: Back

## 2019-01-12 MED ORDER — CEFAZOLIN SODIUM 1 G IJ SOLR
INTRAMUSCULAR | Status: AC
  Filled 2019-01-12: qty 40

## 2019-01-12 MED ORDER — ROCURONIUM BROMIDE 50 MG/5ML IV SOSY
PREFILLED_SYRINGE | INTRAVENOUS | Status: AC
  Filled 2019-01-12: qty 5

## 2019-01-12 MED ORDER — SODIUM CHLORIDE 0.9% FLUSH
3.0000 mL | Freq: Two times a day (BID) | INTRAVENOUS | Status: DC
  Administered 2019-01-13 – 2019-01-14 (×4): 3 mL via INTRAVENOUS

## 2019-01-12 MED ORDER — ALUM & MAG HYDROXIDE-SIMETH 200-200-20 MG/5ML PO SUSP: 30.0000 mL | Freq: Four times a day (QID) | ORAL | Status: DC | PRN

## 2019-01-12 MED ORDER — BUPIVACAINE HCL (PF) 0.5 % IJ SOLN
INTRAMUSCULAR | Status: DC | PRN
  Administered 2019-01-12: 25 mL
  Administered 2019-01-12: 4 mL

## 2019-01-12 MED ORDER — DEXAMETHASONE SODIUM PHOSPHATE 10 MG/ML IJ SOLN
INTRAMUSCULAR | Status: AC
  Filled 2019-01-12: qty 1

## 2019-01-12 MED ORDER — CHLORHEXIDINE GLUCONATE CLOTH 2 % EX PADS: 6.0000 | MEDICATED_PAD | Freq: Once | CUTANEOUS | Status: DC

## 2019-01-12 MED ORDER — ONDANSETRON HCL 4 MG/2ML IJ SOLN
INTRAMUSCULAR | Status: DC | PRN
  Administered 2019-01-12: 4 mg via INTRAVENOUS

## 2019-01-12 MED ORDER — KETOROLAC TROMETHAMINE 30 MG/ML IJ SOLN
INTRAMUSCULAR | Status: DC | PRN
  Administered 2019-01-12: 15 mg via INTRAVENOUS

## 2019-01-12 MED ORDER — PROPYLENE GLYCOL 0.6 % OP SOLN: Freq: Two times a day (BID) | OPHTHALMIC | Status: DC | PRN

## 2019-01-12 MED ORDER — SODIUM CHLORIDE 0.9% FLUSH: 3.0000 mL | INTRAVENOUS | Status: DC | PRN

## 2019-01-12 MED ORDER — SODIUM CHLORIDE 0.9 % IV SOLN
INTRAVENOUS | Status: DC | PRN
  Administered 2019-01-12: 35 ug/min via INTRAVENOUS

## 2019-01-12 MED ORDER — LIDOCAINE-EPINEPHRINE 1 %-1:100000 IJ SOLN
INTRAMUSCULAR | Status: AC
  Filled 2019-01-12: qty 1

## 2019-01-12 MED ORDER — FENTANYL CITRATE (PF) 250 MCG/5ML IJ SOLN
INTRAMUSCULAR | Status: DC | PRN
  Administered 2019-01-12: 50 ug via INTRAVENOUS
  Administered 2019-01-12: 100 ug via INTRAVENOUS
  Administered 2019-01-12 (×2): 50 ug via INTRAVENOUS

## 2019-01-12 MED ORDER — LIDOCAINE 2% (20 MG/ML) 5 ML SYRINGE
INTRAMUSCULAR | Status: DC | PRN
  Administered 2019-01-12: 60 mg via INTRAVENOUS

## 2019-01-12 MED ORDER — SODIUM CHLORIDE 0.9 % IV SOLN: 250.0000 mL | INTRAVENOUS | Status: DC

## 2019-01-12 MED ORDER — LIDOCAINE 2% (20 MG/ML) 5 ML SYRINGE
INTRAMUSCULAR | Status: AC
  Filled 2019-01-12: qty 5

## 2019-01-12 MED ORDER — OXYCODONE-ACETAMINOPHEN 5-325 MG PO TABS
ORAL_TABLET | ORAL | Status: AC
  Filled 2019-01-12: qty 1

## 2019-01-12 MED ORDER — 0.9 % SODIUM CHLORIDE (POUR BTL) OPTIME
TOPICAL | Status: DC | PRN
  Administered 2019-01-12: 1000 mL

## 2019-01-12 MED ORDER — FENTANYL CITRATE (PF) 250 MCG/5ML IJ SOLN
INTRAMUSCULAR | Status: AC
  Filled 2019-01-12: qty 5

## 2019-01-12 MED ORDER — MIDAZOLAM HCL 2 MG/2ML IJ SOLN
INTRAMUSCULAR | Status: AC
  Filled 2019-01-12: qty 2

## 2019-01-12 MED ORDER — PANTOPRAZOLE SODIUM 40 MG PO TBEC
40.0000 mg | DELAYED_RELEASE_TABLET | Freq: Two times a day (BID) | ORAL | Status: DC
  Administered 2019-01-12 – 2019-01-15 (×6): 40 mg via ORAL
  Filled 2019-01-12 (×6): qty 1

## 2019-01-12 MED ORDER — SODIUM CHLORIDE 0.9 % IV SOLN
INTRAVENOUS | Status: DC | PRN
  Administered 2019-01-12: 11:00:00

## 2019-01-12 MED ORDER — SUCCINYLCHOLINE CHLORIDE 200 MG/10ML IV SOSY
PREFILLED_SYRINGE | INTRAVENOUS | Status: AC
  Filled 2019-01-12: qty 10

## 2019-01-12 MED ORDER — DEXAMETHASONE SODIUM PHOSPHATE 10 MG/ML IJ SOLN
INTRAMUSCULAR | Status: DC | PRN
  Administered 2019-01-12: 10 mg via INTRAVENOUS

## 2019-01-12 MED ORDER — ACETAMINOPHEN 325 MG PO TABS
650.0000 mg | ORAL_TABLET | ORAL | Status: DC | PRN
  Administered 2019-01-14 (×4): 650 mg via ORAL
  Filled 2019-01-12 (×5): qty 2

## 2019-01-12 MED ORDER — FLEET ENEMA 7-19 GM/118ML RE ENEM: 1.0000 | ENEMA | Freq: Once | RECTAL | Status: DC | PRN

## 2019-01-12 MED ORDER — ONDANSETRON HCL 4 MG/2ML IJ SOLN: 4.0000 mg | Freq: Four times a day (QID) | INTRAMUSCULAR | Status: DC | PRN

## 2019-01-12 MED ORDER — ALBUMIN HUMAN 5 % IV SOLN
INTRAVENOUS | Status: DC | PRN
  Administered 2019-01-12: 14:00:00 via INTRAVENOUS

## 2019-01-12 MED ORDER — THROMBIN 20000 UNITS EX SOLR
CUTANEOUS | Status: AC
  Filled 2019-01-12: qty 20000

## 2019-01-12 MED ORDER — PROPOFOL 10 MG/ML IV BOLUS
INTRAVENOUS | Status: AC
  Filled 2019-01-12: qty 20

## 2019-01-12 MED ORDER — BISACODYL 10 MG RE SUPP
10.0000 mg | Freq: Every day | RECTAL | Status: DC | PRN
  Filled 2019-01-12: qty 1

## 2019-01-12 MED ORDER — PHENOL 1.4 % MT LIQD
1.0000 | OROMUCOSAL | Status: DC | PRN
  Administered 2019-01-12: 1 via OROMUCOSAL
  Filled 2019-01-12: qty 177

## 2019-01-12 MED ORDER — SODIUM CHLORIDE (PF) 0.9 % IJ SOLN
INTRAMUSCULAR | Status: AC
  Filled 2019-01-12: qty 10

## 2019-01-12 MED ORDER — BUPIVACAINE HCL (PF) 0.5 % IJ SOLN
INTRAMUSCULAR | Status: AC
  Filled 2019-01-12: qty 30

## 2019-01-12 MED ORDER — THROMBIN 20000 UNITS EX SOLR
CUTANEOUS | Status: DC | PRN
  Administered 2019-01-12: 11:00:00 via TOPICAL

## 2019-01-12 MED ORDER — FLUTICASONE PROPIONATE 50 MCG/ACT NA SUSP
2.0000 | Freq: Every day | NASAL | Status: DC
  Administered 2019-01-13 – 2019-01-15 (×3): 2 via NASAL
  Filled 2019-01-12: qty 16

## 2019-01-12 MED ORDER — OXYCODONE-ACETAMINOPHEN 5-325 MG PO TABS
1.0000 | ORAL_TABLET | ORAL | Status: DC | PRN
  Administered 2019-01-12: 1 via ORAL
  Administered 2019-01-12: 2 via ORAL
  Administered 2019-01-13 (×2): 1 via ORAL
  Administered 2019-01-15: 2 via ORAL
  Filled 2019-01-12 (×2): qty 2
  Filled 2019-01-12 (×2): qty 1

## 2019-01-12 MED ORDER — MORPHINE SULFATE (PF) 2 MG/ML IV SOLN: 2.0000 mg | INTRAVENOUS | Status: DC | PRN

## 2019-01-12 MED ORDER — GABAPENTIN 300 MG PO CAPS
900.0000 mg | ORAL_CAPSULE | Freq: Every day | ORAL | Status: DC
  Administered 2019-01-12 – 2019-01-14 (×3): 900 mg via ORAL
  Filled 2019-01-12 (×3): qty 3

## 2019-01-12 MED ORDER — THROMBIN 5000 UNITS EX SOLR
CUTANEOUS | Status: AC
  Filled 2019-01-12: qty 5000

## 2019-01-12 MED ORDER — LACTATED RINGERS IV SOLN
INTRAVENOUS | Status: DC
  Administered 2019-01-12 (×2): via INTRAVENOUS

## 2019-01-12 MED ORDER — CEFAZOLIN SODIUM-DEXTROSE 2-4 GM/100ML-% IV SOLN
2.0000 g | Freq: Three times a day (TID) | INTRAVENOUS | Status: AC
  Administered 2019-01-12 – 2019-01-13 (×2): 2 g via INTRAVENOUS
  Filled 2019-01-12 (×2): qty 100

## 2019-01-12 MED ORDER — SENNA 8.6 MG PO TABS
1.0000 | ORAL_TABLET | Freq: Two times a day (BID) | ORAL | Status: DC
  Administered 2019-01-12 – 2019-01-15 (×6): 8.6 mg via ORAL
  Filled 2019-01-12 (×6): qty 1

## 2019-01-12 MED ORDER — ACETAMINOPHEN 650 MG RE SUPP: 650.0000 mg | RECTAL | Status: DC | PRN

## 2019-01-12 MED ORDER — ONDANSETRON HCL 4 MG PO TABS: 4.0000 mg | ORAL_TABLET | Freq: Four times a day (QID) | ORAL | Status: DC | PRN

## 2019-01-12 MED ORDER — POLYETHYLENE GLYCOL 3350 17 G PO PACK
17.0000 g | PACK | Freq: Every day | ORAL | Status: DC | PRN
  Administered 2019-01-13: 17 g via ORAL
  Filled 2019-01-12: qty 1

## 2019-01-12 MED ORDER — ONDANSETRON HCL 4 MG/2ML IJ SOLN
INTRAMUSCULAR | Status: AC
  Filled 2019-01-12: qty 2

## 2019-01-12 MED ORDER — PROPOFOL 10 MG/ML IV BOLUS
INTRAVENOUS | Status: DC | PRN
  Administered 2019-01-12: 110 mg via INTRAVENOUS

## 2019-01-12 MED ORDER — DOCUSATE SODIUM 100 MG PO CAPS
100.0000 mg | ORAL_CAPSULE | Freq: Two times a day (BID) | ORAL | Status: DC
  Administered 2019-01-12 – 2019-01-15 (×6): 100 mg via ORAL
  Filled 2019-01-12 (×6): qty 1

## 2019-01-12 MED ORDER — HYDROMORPHONE HCL 1 MG/ML IJ SOLN
INTRAMUSCULAR | Status: AC
  Filled 2019-01-12: qty 1

## 2019-01-12 MED ORDER — ACETAMINOPHEN 500 MG PO TABS
1000.0000 mg | ORAL_TABLET | Freq: Once | ORAL | Status: AC
  Administered 2019-01-12: 1000 mg via ORAL
  Filled 2019-01-12: qty 2

## 2019-01-12 MED ORDER — METHOCARBAMOL 1000 MG/10ML IJ SOLN
500.0000 mg | Freq: Four times a day (QID) | INTRAVENOUS | Status: DC | PRN
  Filled 2019-01-12: qty 5

## 2019-01-12 MED ORDER — POLYVINYL ALCOHOL 1.4 % OP SOLN: 1.0000 [drp] | OPHTHALMIC | Status: DC | PRN

## 2019-01-12 MED ORDER — SUGAMMADEX SODIUM 200 MG/2ML IV SOLN
INTRAVENOUS | Status: DC | PRN
  Administered 2019-01-12: 147 mg via INTRAVENOUS

## 2019-01-12 MED ORDER — LACTATED RINGERS IV SOLN
INTRAVENOUS | Status: DC
  Administered 2019-01-12 (×2): via INTRAVENOUS

## 2019-01-12 MED ORDER — METHOCARBAMOL 500 MG PO TABS
500.0000 mg | ORAL_TABLET | Freq: Four times a day (QID) | ORAL | Status: DC | PRN
  Administered 2019-01-12: 500 mg via ORAL
  Filled 2019-01-12: qty 1

## 2019-01-12 MED ORDER — MIDAZOLAM HCL 5 MG/5ML IJ SOLN
INTRAMUSCULAR | Status: DC | PRN
  Administered 2019-01-12: 0.5 mg via INTRAVENOUS

## 2019-01-12 MED ORDER — CEFAZOLIN SODIUM-DEXTROSE 2-4 GM/100ML-% IV SOLN
2.0000 g | INTRAVENOUS | Status: AC
  Administered 2019-01-12 (×2): 2 g via INTRAVENOUS
  Filled 2019-01-12: qty 100

## 2019-01-12 MED ORDER — ROCURONIUM BROMIDE 50 MG/5ML IV SOSY
PREFILLED_SYRINGE | INTRAVENOUS | Status: DC | PRN
  Administered 2019-01-12: 10 mg via INTRAVENOUS
  Administered 2019-01-12: 50 mg via INTRAVENOUS
  Administered 2019-01-12: 20 mg via INTRAVENOUS
  Administered 2019-01-12: 10 mg via INTRAVENOUS

## 2019-01-12 MED ORDER — HYDROMORPHONE HCL 1 MG/ML IJ SOLN
0.2500 mg | INTRAMUSCULAR | Status: DC | PRN
  Administered 2019-01-12 (×4): 0.25 mg via INTRAVENOUS

## 2019-01-12 MED ORDER — THROMBIN 5000 UNITS EX SOLR
OROMUCOSAL | Status: DC | PRN
  Administered 2019-01-12 (×2): via TOPICAL

## 2019-01-12 MED ORDER — MENTHOL 3 MG MT LOZG: 1.0000 | LOZENGE | OROMUCOSAL | Status: DC | PRN

## 2019-01-12 MED ORDER — KETOROLAC TROMETHAMINE 30 MG/ML IJ SOLN
INTRAMUSCULAR | Status: AC
  Filled 2019-01-12: qty 1

## 2019-01-12 MED ORDER — LIDOCAINE-EPINEPHRINE 1 %-1:100000 IJ SOLN
INTRAMUSCULAR | Status: DC | PRN
  Administered 2019-01-12: 4 mL

## 2019-01-12 MED ORDER — KETOROLAC TROMETHAMINE 15 MG/ML IJ SOLN
7.5000 mg | Freq: Four times a day (QID) | INTRAMUSCULAR | Status: DC
  Administered 2019-01-12 – 2019-01-13 (×3): 7.5 mg via INTRAVENOUS
  Filled 2019-01-12 (×3): qty 1

## 2019-01-12 MED ORDER — LORATADINE 10 MG PO TABS
10.0000 mg | ORAL_TABLET | Freq: Every day | ORAL | Status: DC
  Administered 2019-01-13 – 2019-01-15 (×3): 10 mg via ORAL
  Filled 2019-01-12 (×3): qty 1

## 2019-01-12 SURGICAL SUPPLY — 61 items
BAG DECANTER FOR FLEXI CONT (MISCELLANEOUS) ×3 IMPLANT
BASKET BONE COLLECTION (BASKET) ×3 IMPLANT
BLADE CLIPPER SURG (BLADE) IMPLANT
BUR MATCHSTICK NEURO 3.0 LAGG (BURR) ×3 IMPLANT
CAGE COROENT MP 8X9X23M-8 SPIN (Cage) ×6 IMPLANT
CAGE PLIF 8X9X23-12 LUMBAR (Cage) ×6 IMPLANT
CANISTER SUCT 3000ML PPV (MISCELLANEOUS) ×3 IMPLANT
CONT SPEC 4OZ CLIKSEAL STRL BL (MISCELLANEOUS) ×3 IMPLANT
COVER BACK TABLE 60X90IN (DRAPES) ×3 IMPLANT
COVER WAND RF STERILE (DRAPES) IMPLANT
DECANTER SPIKE VIAL GLASS SM (MISCELLANEOUS) ×3 IMPLANT
DERMABOND ADVANCED (GAUZE/BANDAGES/DRESSINGS) ×2
DERMABOND ADVANCED .7 DNX12 (GAUZE/BANDAGES/DRESSINGS) ×1 IMPLANT
DEVICE DISSECT PLASMABLAD 3.0S (MISCELLANEOUS) ×1 IMPLANT
DRAPE C-ARM 42X72 X-RAY (DRAPES) ×6 IMPLANT
DRAPE HALF SHEET 40X57 (DRAPES) ×3 IMPLANT
DRAPE LAPAROTOMY 100X72X124 (DRAPES) ×3 IMPLANT
DURAPREP 26ML APPLICATOR (WOUND CARE) ×3 IMPLANT
DURASEAL APPLICATOR TIP (TIP) IMPLANT
DURASEAL SPINE SEALANT 3ML (MISCELLANEOUS) IMPLANT
ELECT REM PT RETURN 9FT ADLT (ELECTROSURGICAL) ×3
ELECTRODE REM PT RTRN 9FT ADLT (ELECTROSURGICAL) ×1 IMPLANT
GAUZE 4X4 16PLY RFD (DISPOSABLE) ×3 IMPLANT
GAUZE SPONGE 4X4 12PLY STRL (GAUZE/BANDAGES/DRESSINGS) ×3 IMPLANT
GLOVE BIOGEL PI IND STRL 8.5 (GLOVE) ×2 IMPLANT
GLOVE BIOGEL PI INDICATOR 8.5 (GLOVE) ×4
GLOVE ECLIPSE 8.5 STRL (GLOVE) ×6 IMPLANT
GOWN STRL REUS W/ TWL LRG LVL3 (GOWN DISPOSABLE) IMPLANT
GOWN STRL REUS W/ TWL XL LVL3 (GOWN DISPOSABLE) IMPLANT
GOWN STRL REUS W/TWL 2XL LVL3 (GOWN DISPOSABLE) ×6 IMPLANT
GOWN STRL REUS W/TWL LRG LVL3 (GOWN DISPOSABLE)
GOWN STRL REUS W/TWL XL LVL3 (GOWN DISPOSABLE)
HEMOSTAT POWDER KIT SURGIFOAM (HEMOSTASIS) ×6 IMPLANT
KIT BASIN OR (CUSTOM PROCEDURE TRAY) ×3 IMPLANT
KIT INFUSE MEDIUM (Orthopedic Implant) ×3 IMPLANT
KIT TURNOVER KIT B (KITS) ×3 IMPLANT
MILL MEDIUM DISP (BLADE) ×3 IMPLANT
NEEDLE HYPO 22GX1.5 SAFETY (NEEDLE) ×3 IMPLANT
NEEDLE SPNL 18GX3.5 QUINCKE PK (NEEDLE) IMPLANT
NS IRRIG 1000ML POUR BTL (IV SOLUTION) ×3 IMPLANT
PACK LAMINECTOMY NEURO (CUSTOM PROCEDURE TRAY) ×3 IMPLANT
PAD ARMBOARD 7.5X6 YLW CONV (MISCELLANEOUS) ×9 IMPLANT
PATTIES SURGICAL .5 X1 (DISPOSABLE) ×3 IMPLANT
PLASMABLADE 3.0S (MISCELLANEOUS) ×3
ROD RELINE LOROTIC TI 5.5X55MM (Rod) ×3 IMPLANT
ROD RELINE-O LORDOTIC 5.5X60MM (Rod) ×3 IMPLANT
SCREW LOCK RELINE 5.5 TULIP (Screw) ×18 IMPLANT
SCREW RELINE-O POLY 6.5X45 (Screw) ×18 IMPLANT
SPONGE LAP 4X18 RFD (DISPOSABLE) IMPLANT
SPONGE SURGIFOAM ABS GEL 100 (HEMOSTASIS) ×3 IMPLANT
SUT PROLENE 6 0 BV (SUTURE) IMPLANT
SUT VIC AB 1 CT1 18XBRD ANBCTR (SUTURE) ×1 IMPLANT
SUT VIC AB 1 CT1 8-18 (SUTURE) ×2
SUT VIC AB 2-0 CP2 18 (SUTURE) ×3 IMPLANT
SUT VIC AB 3-0 SH 8-18 (SUTURE) ×6 IMPLANT
SYR 3ML LL SCALE MARK (SYRINGE) ×12 IMPLANT
SYR 5ML LL (SYRINGE) IMPLANT
TOWEL GREEN STERILE (TOWEL DISPOSABLE) ×3 IMPLANT
TOWEL GREEN STERILE FF (TOWEL DISPOSABLE) ×3 IMPLANT
TRAY FOLEY MTR SLVR 16FR STAT (SET/KITS/TRAYS/PACK) ×3 IMPLANT
WATER STERILE IRR 1000ML POUR (IV SOLUTION) ×3 IMPLANT

## 2019-01-12 NOTE — Transfer of Care (Signed)
Immediate Anesthesia Transfer of Care Note  Patient: Katherine Gates  Procedure(s) Performed: Lumbar Three-Four Lumbar Four-Five Posterior lumbar interbody fusion (N/A Back)  Patient Location: PACU  Anesthesia Type:General  Level of Consciousness: drowsy and patient cooperative  Airway & Oxygen Therapy: Patient Spontanous Breathing and Patient connected to face mask oxygen  Post-op Assessment: Report given to RN, Post -op Vital signs reviewed and stable and Patient moving all extremities  Post vital signs: Reviewed and stable  Last Vitals:  Vitals Value Taken Time  BP 134/62 01/12/2019  2:43 PM  Temp    Pulse 68 01/12/2019  2:43 PM  Resp 15 01/12/2019  2:43 PM  SpO2 99 % 01/12/2019  2:43 PM  Vitals shown include unvalidated device data.  Last Pain:  Vitals:   01/12/19 0910  TempSrc:   PainSc: 0-No pain         Complications: No apparent anesthesia complications

## 2019-01-12 NOTE — H&P (Signed)
CHIEF COMPLAINT: Back and bilateral leg pain, right worse than the left.  HISTORY OF PRESENT ILLNESS: Ms. Katherine Gates is an 85 year old right-handed individual who has been having problems for over 2 years period of time with low back pain that radiates into her lower extremities, primarily on the right side.  She notes that the pain will radiate from her buttocks and had become so severe that it feels that it is incapacitating.  She finds that she cannot walk very far at all.  It is painful to stand in 1 position for any length of time, and in general, she feels that there is a growing weakness in her legs.  She has had numbness and tingling down the right leg and some down the left into the feet.  She has been told that she has some peripheral neuropathy.  Ultimately, she underwent a workup, including an MRI of the lumbar spine.  This was actually done on 2 occasions, once in 2018 and then again this past summer, in July of 2019.  The studies demonstrate that Katherine Gates has developed a significant spondylolisthesis at L3-4 and L4-5 that causes markedly severe central canal stenosis at each of these levels.  She has not had any plain radiographs of the lumbar spine, and today in the office, I obtained plain AP and lateral radiographs of the lumbar spine with a flexion-extension view.  These radiographs demonstrate that she has a 7 mm anterolisthesis at L3-4 and a 7 mm anterolisthesis at L4-5 that does not reduce with standing or extension.  She does also have the start of a degenerative scoliosis in the lower lumbar spine that is apparent on the AP view.  She notes herself that she has been walking with a slight tilt to the right side.  PAST MEDICAL HISTORY: Reveals that her general health has been very good.  She has had breast cancer in the past, and this was treated in 2017.  She was placed on some antiestrogen drugs but notes that she did not tolerate these and currently is not on any medication.  She has  had a recent bone mineral density study this past summer, which demonstrates that overall she has minimal evidence of osteopenia in only 1 bony area and otherwise looks fairly healthy.  She has seen Dr. Carloyn Manner, who advised surgical intervention but noted that because of his upcoming retirement, he does not want to undertake her surgery and her postoperative care.  He kindly referred her here for this evaluation.  Her other past medical history reveals that she has some reflux.  CURRENT MEDICATIONS: Include a baby Aspirin daily, Celebrex twice a day, Allegra, Gabapentin 300 mg at bedtime for neuropathy in her legs, and Pantoprazole 40 mg twice daily for reflux.  ALLERGIES: She notes an allergy to Codeine, which causes nausea.  REVIEW OF SYSTEMS: Systems review is notable for some sinus problems, leg pain while walking, arthritis, back pain on a 14-point review sheet.  PHYSICAL EXAMINATION: Musculoskeletal:  walks without antalgia.  She does have a slight tilt to the right side when she walks.  Her motor strength reveals that she has good strength in her iliopsoas, quadriceps, and gastrocnemii.  Tibialis anterior on the left side suggests some modest weakness on attempting to heel-walk.  To confrontational testing.  However, this appears to be intact.  Straight leg raising is positive at 30 degrees in either lower extremity, and deep tendon reflexes are 1+ in the patellae and absent Achilles bilaterally.  IMPRESSION AND  PLAN: The patient has evidence of an advanced spondylolisthesis at L3-4 and L4-5 by 2 MRIs obtained a year part.  Plain x-rays demonstrate that she does not reduce in the supine position, and the spondylolisthesis appears to be fairly fixed.  I noted that given the difficulties that she has been having with the pain that she has been having for the duration that has been occurring, I would advise surgical decompression of L3-4 and L4-5 via laminectomy, removal of the overgrown facet joints  at L3-4 and L4-5, posterior lumbar interbody arthrodesis with polyetheretherketone spacers, pedicle screw fixation from L3 to L5 with posterolateral arthrodesis.  Given the good overall bone health that she has, I do not believe that she will require augmentation, but this is something that can be considered when the surgery is actually being performed, if the bone is particularly soft.  I believe that the surgery will require a hospital stay of about 4 days or so.  Thereafter, she will have to wear a corset type brace around her lower back for a period of about 2 months.  Mobilization will gradually improve after that.  I am hopeful that given her good overall physical state that she should do well with the surgery and be able to return to most normal activities in about 3-4 months after surgery.  The surgery, however, is a major undertaking, and I do believe that there is substantial room for clinical improvement in her situation.

## 2019-01-12 NOTE — Anesthesia Postprocedure Evaluation (Signed)
Anesthesia Post Note  Patient: Katherine Gates  Procedure(s) Performed: Lumbar Three-Four Lumbar Four-Five Posterior lumbar interbody fusion (N/A Back)     Patient location during evaluation: PACU Anesthesia Type: General Level of consciousness: awake and alert Pain management: pain level controlled Vital Signs Assessment: post-procedure vital signs reviewed and stable Respiratory status: spontaneous breathing, nonlabored ventilation and respiratory function stable Cardiovascular status: blood pressure returned to baseline and stable Postop Assessment: no apparent nausea or vomiting Anesthetic complications: no    Last Vitals:  Vitals:   01/12/19 1630 01/12/19 1645  BP:    Pulse: 66 69  Resp: 12 19  Temp:    SpO2: 97% 97%    Last Pain:  Vitals:   01/12/19 1520  TempSrc:   PainSc: 10-Worst pain ever                 Leisel Pinette,W. EDMOND

## 2019-01-12 NOTE — Anesthesia Preprocedure Evaluation (Addendum)
Anesthesia Evaluation  Patient identified by MRN, date of birth, ID band Patient awake    Reviewed: Allergy & Precautions, H&P , NPO status , Patient's Chart, lab work & pertinent test results  Airway Mallampati: III  TM Distance: >3 FB Neck ROM: Full    Dental no notable dental hx. (+) Teeth Intact, Dental Advisory Given   Pulmonary neg pulmonary ROS,    Pulmonary exam normal breath sounds clear to auscultation       Cardiovascular negative cardio ROS   Rhythm:Regular Rate:Normal     Neuro/Psych negative neurological ROS  negative psych ROS   GI/Hepatic Neg liver ROS, GERD  Medicated and Controlled,  Endo/Other  negative endocrine ROS  Renal/GU negative Renal ROS  negative genitourinary   Musculoskeletal  (+) Arthritis , Osteoarthritis,    Abdominal   Peds  Hematology negative hematology ROS (+)   Anesthesia Other Findings   Reproductive/Obstetrics negative OB ROS                            Anesthesia Physical Anesthesia Plan  ASA: II  Anesthesia Plan: General   Post-op Pain Management:    Induction: Intravenous  PONV Risk Score and Plan: 4 or greater and Ondansetron, Dexamethasone and Treatment may vary due to age or medical condition  Airway Management Planned: Oral ETT  Additional Equipment:   Intra-op Plan:   Post-operative Plan: Extubation in OR  Informed Consent: I have reviewed the patients History and Physical, chart, labs and discussed the procedure including the risks, benefits and alternatives for the proposed anesthesia with the patient or authorized representative who has indicated his/her understanding and acceptance.     Dental advisory given  Plan Discussed with: CRNA  Anesthesia Plan Comments:         Anesthesia Quick Evaluation

## 2019-01-12 NOTE — Anesthesia Procedure Notes (Signed)
Procedure Name: Intubation Date/Time: 01/12/2019 10:12 AM Performed by: Renato Shin, CRNA Pre-anesthesia Checklist: Patient identified, Emergency Drugs available, Suction available and Patient being monitored Patient Re-evaluated:Patient Re-evaluated prior to induction Oxygen Delivery Method: Circle system utilized Preoxygenation: Pre-oxygenation with 100% oxygen Induction Type: IV induction Ventilation: Mask ventilation without difficulty Laryngoscope Size: Miller and 2 Grade View: Grade I Tube type: Oral Tube size: 7.5 mm Number of attempts: 1 Airway Equipment and Method: Stylet Placement Confirmation: ETT inserted through vocal cords under direct vision,  positive ETCO2 and breath sounds checked- equal and bilateral Secured at: 21 cm Tube secured with: Tape Dental Injury: Teeth and Oropharynx as per pre-operative assessment

## 2019-01-12 NOTE — Progress Notes (Signed)
Patient admitted from PACU. Alert and oriented. Oriented to room. Call light in reach. VSS. Surgical site CDI. Stood and walked to bed from stretcher.

## 2019-01-12 NOTE — Op Note (Signed)
Date of surgery: 01/12/2019 Preoperative diagnosis: Spondylolisthesis and stenosis L3-4 and L4-5 with neurogenic claudication and lumbar radiculopathy Postoperative diagnosis: Same Procedure: Laminectomy L3 and L4 with decompression of the L3-L4 and L5 nerve roots with more work than required for simple interbody technique.  Posterior lumbar interbody arthrodesis using peek spacers local autograft allograft and infuse L3-4 and L4-5.  Segmental fixation with pedicle screws L3-L4 and L5 posterior lateral arthrodesis with local autograft allograft and infuse L3-L5. Surgeon: Kristeen Miss Assistant: Earle Gell, MD Indications: Katherine Gates is a 85 year old individual whose had significant back and bilateral leg pain and weakness.  She has advanced spondylolisthesis with severe stenosis L3-4 and L4-5.  She was advised regarding surgical decompression and stabilization from L3-L5.  Procedure:   Patient was brought to the operating room supine on the stretcher.  After the smooth induction of general tracheal anesthesia she was carefully turned prone.  The bony prominences were appropriately padded and protected.  The back was prepped with alcohol DuraPrep and draped in sterile fashion.  Midline incision was created in the lower lumbar spine this was carried down to the lumbodorsal fascia.  The fascia was opened on either side of the midline and the first identifiable spinous processes were L3 and L4 on a radiograph.  Then by doing a sublaminar dissection the laminar arches out to the facet joints at L3-4 L4-5 were all decorticated.  The lateral gutters over the transverse processes from L3-L5 were packed off.  This provided a grafting surface for an intertransverse fusion L3-L5.  Then laminectomy was performed removing the inferior margin lamina of L3 out to and including the entirety of the facet.  Complete laminectomy of L4 was performed and then the redundant thickened yellow ligament in this area was taken up and  the common dural tube was decompressed as was the path of the L3 the L4 and the L5 nerve roots individually with a 2 mm Kerrison punch and a high-speed drill.  Ultimately the disc spaces were isolated.  Then at L4-5 there was noted to be a significant disc herniation subligamentously on the left side and this was taken out.  The disc space was entered at L4-5 bilaterally and a complete discectomy was performed using a combination of curettes and rongeurs and disc shavers to free up the disc material once the disc space was completely emptied a self-retaining spreader was used to reduce the spondylolisthesis and place a 12 degree 9 mm lordotic cage on each side at L4-5 along with 9 cc of autograft allograft and infuse which was mixed on the side table.  Attention was turned to L3-4 were similar process was carried out and here 8 degree lordotic cages measuring 8 mm in height 25 mm in depth were placed into the interspace along with 6 cc of autograft allograft and infuse.  Lateral gutters were then packed with an additional 6 cc of autograft allograft and infuse in the intertransverse space at L3-L4 and L5 and using x-ray control we placed pedicle screws in L3-L4 and L5 fluoroscopy was used for this purpose 6.5 x 45 mm screws were placed in each of the openings.  Then on the right side a 55 mm precontoured rod was placed on the left side a 60 mm precontoured rod was placed and tightened in a neutral construct.  The area was inspected carefully to make sure adequate hemostasis in the soft tissues was obtained and to also make sure that the L3 the L4 and L5 nerve roots  in addition to the common dural tube were well decompressed.  Once this was verified final radiographs were obtained in AP and lateral projections and then the lumbodorsal fascia was closed with #1 Vicryl in interrupted fashion after 25 cc of half percent Marcaine was injected into the paraspinous fascia and tissue the subcutaneous tissue was closed with  2-0 Vicryl and 3-0 Vicryl was used subcuticularly.  Dermabond was placed on the skin and blood loss was estimated 250 cc.

## 2019-01-12 NOTE — Progress Notes (Signed)
Patient ID: Katherine Gates, female   DOB: 23-Feb-1933, 85 y.o.   MRN: [id number] Vital signs are stable Incision is clean and dry Motor function is intact Patient is reasonably comfortable after surgery Awaiting bed on 4 [address]. progressive

## 2019-01-13 LAB — CBC
HCT: 32.8 % — ABNORMAL LOW (ref 36.0–46.0)
HEMOGLOBIN: 10.6 g/dL — AB (ref 12.0–15.0)
MCH: 30 pg (ref 26.0–34.0)
MCHC: 32.3 g/dL (ref 30.0–36.0)
MCV: 92.9 fL (ref 80.0–100.0)
Platelets: 230 10*3/uL (ref 150–400)
RBC: 3.53 MIL/uL — ABNORMAL LOW (ref 3.87–5.11)
RDW: 13.4 % (ref 11.5–15.5)
WBC: 14 10*3/uL — ABNORMAL HIGH (ref 4.0–10.5)
nRBC: 0 % (ref 0.0–0.2)

## 2019-01-13 LAB — BASIC METABOLIC PANEL
Anion gap: 8 (ref 5–15)
BUN: 21 mg/dL (ref 8–23)
CO2: 25 mmol/L (ref 22–32)
Calcium: 8.6 mg/dL — ABNORMAL LOW (ref 8.9–10.3)
Chloride: 106 mmol/L (ref 98–111)
Creatinine, Ser: 1.21 mg/dL — ABNORMAL HIGH (ref 0.44–1.00)
GFR calc Af Amer: 48 mL/min — ABNORMAL LOW (ref >60)
GFR calc non Af Amer: 41 mL/min — ABNORMAL LOW (ref >60)
Glucose, Bld: 140 mg/dL — ABNORMAL HIGH (ref 70–99)
Potassium: 4 mmol/L (ref 3.5–5.1)
Sodium: 139 mmol/L (ref 135–145)

## 2019-01-13 MED FILL — Thrombin For Soln 5000 Unit: CUTANEOUS | Qty: 5000 | Status: AC

## 2019-01-13 MED FILL — Gelatin Absorbable MT Powder: OROMUCOSAL | Qty: 1 | Status: AC

## 2019-01-13 NOTE — Progress Notes (Signed)
Foley removed. Patient ambulated to door of room and back to chair. Sitting up.

## 2019-01-13 NOTE — Progress Notes (Signed)
Occupational Therapy Evaluation Patient Details Name: Katherine Gates MRN: [id number] DOB: May 26, 1933 Today's Date: 01/13/2019    History of Present Illness Pt presents with back pain into buttocks and down both legs, R>L. PMH: breast cancer. Underwent L3-5 PLIF.   Clinical Impression    PTA, pt independent with ADL and mobility. Began education regarding back precautions for ADL and functional mobility. Pt mobilizing with S @ RW level and min A with LB ADL. Pt complaining of R shoulder/upper trap pain. Discussed need to follow up with outpt therapy after DC for R shoulder. Will follow acutely to facilitate safe DC home.   Follow Up Recommendations  No OT follow up;Supervision - Intermittent  Will need to follow up with outpt therapy for R shoulder when released by Dr. Ellene Route  Equipment Recommendations  3 in 1 bedside commode    Recommendations for Other Services       Precautions / Restrictions Precautions Precautions: Back Required Braces or Orthoses: Spinal Brace Spinal Brace: Lumbar corset;Applied in sitting position Restrictions Weight Bearing Restrictions: No(Simultaneous filing. User may not have seen previous data.)      Mobility Bed Mobility Overal bed mobility: Needs Assistance Bed Mobility: Rolling;Sit to Sidelying;Sidelying to Sit Rolling: Supervision Sidelying to sit: Supervision     Sit to sidelying: Supervision General bed mobility comments: vc's for technique  Transfers Overall transfer level: Needs assistance Equipment used: Rolling walker (2 wheeled) Transfers: Sit to/from Stand Sit to Stand: Supervision         General transfer comment: vc's for hand placement, no LOB    Balance Overall balance assessment: Mild deficits observed, not formally tested                                         ADL either performed or assessed with clinical judgement   ADL Overall ADL's : Needs assistance/impaired     Grooming:  Supervision/safety;Standing   Upper Body Bathing: Set up;Sitting   Lower Body Bathing: Minimal assistance;Sit to/from stand   Upper Body Dressing : Minimal assistance Upper Body Dressing Details (indicate cue type and reason): with brace Lower Body Dressing: Minimal assistance;Sit to/from stand   Toilet Transfer: Min guard;Ambulation;RW   Toileting- Clothing Manipulation and Hygiene: Moderate assistance       Functional mobility during ADLs: Min guard;Rolling walker;Cueing for safety General ADL Comments: May need AE for toileting: Began educating on compensatory techniques and use of AE for ADL following back precautions     Vision         Perception     Praxis      Pertinent Vitals/Pain Pain Assessment: Faces Pain Score: 4  Pain Location: back Pain Descriptors / Indicators: Operative site guarding;Sore Pain Intervention(s): Limited activity within patient's tolerance     Hand Dominance Right   Extremity/Trunk Assessment Upper Extremity Assessment Upper Extremity Assessment: RUE deficits/detail RUE Deficits / Details: Complaining of RUE pain and shoulder pain from before surgery. +impingement test; complains of upper trap pain - apparent trigger points; - Spurlings       Cervical / Trunk Assessment Cervical / Trunk Assessment: Kyphotic   Communication Communication Communication: No difficulties   Cognition Arousal/Alertness: Awake/alert Behavior During Therapy: WFL for tasks assessed/performed Overall Cognitive Status: Within Functional Limits for tasks assessed  General Comments       Exercises     Shoulder Instructions      Home Living Family/patient expects to be discharged to:: Private residence Living Arrangements: Spouse/significant other Available Help at Discharge: Family;Available 24 hours/day Type of Home: House Home Access: Stairs to enter [employer] of Steps: 3 Entrance  Stairs-Rails: Right Home Layout: One level     Bathroom Shower/Tub: Occupational psychologist: Handicapped height Bathroom Accessibility: Yes How Accessible: Accessible via walker Home Equipment: None          Prior Functioning/Environment Level of Independence: Independent        Comments: likes to travel and play golf        OT Problem List: Decreased range of motion;Decreased activity tolerance;Decreased knowledge of use of DME or AE;Decreased knowledge of precautions;Pain      OT Treatment/Interventions: Self-care/ADL training;DME and/or AE instruction;Therapeutic activities;Balance training;Patient/family education    OT Goals(Current goals can be found in the care plan section) Acute Rehab OT Goals Patient Stated Goal: return to home and traveling OT Goal Formulation: With patient Time For Goal Achievement: 01/27/19 Potential to Achieve Goals: Good  OT Frequency: Min 3X/week   Barriers to D/C:            Co-evaluation              AM-PAC OT "6 Clicks" Daily Activity     Outcome Measure Help from another person eating meals?: None Help from another person taking care of personal grooming?: A Moroz Help from another person toileting, which includes using toliet, bedpan, or urinal?: A Senger Help from another person bathing (including washing, rinsing, drying)?: A Weichel Help from another person to put on and taking off regular upper body clothing?: A Biddy Help from another person to put on and taking off regular lower body clothing?: A Codispoti 6 Click Score: 19   End of Session Equipment Utilized During Treatment: Gait belt;Rolling walker Nurse Communication: Mobility status  Activity Tolerance: Patient tolerated treatment well Patient left: in bed;with call bell/phone within reach;with family/visitor present  OT Visit Diagnosis: Unsteadiness on feet (R26.81);Pain Pain - part of body: (back)                Time: 1500-1530 OT Time  Calculation (min): 30 min Charges:  OT General Charges $OT Visit: 1 Visit OT Evaluation $OT Eval Moderate Complexity: 1 Mod OT Treatments $Self Care/Home Management : 8-22 mins  Maurie Boettcher, OT/L   Acute OT Clinical Specialist Acute Rehabilitation Services Pager [phone] Office [phone]   [facility] 01/13/2019, 4:47 PM

## 2019-01-13 NOTE — Evaluation (Signed)
Physical Therapy Evaluation Patient Details Name: Katherine Gates MRN: [id number] DOB: 1932/12/14 Today's Date: 01/13/2019   History of Present Illness  Pt presents with back pain into buttocks and down both legs, R>L. PMH: breast cancer. Underwent L3-5 PLIF.  Clinical Impression  Pt admitted with above diagnosis. Pt currently with functional limitations due to the deficits listed below (see PT Problem List). Pt ambulated 200' with RW and supervision, practiced 4 steps with rail and min-guard. Pt with significantly less pain RLE than before surgery. Dr Ellene Route did not that he will be stopping tordol due to creatinine changes so  We will see how she mobilizes tomorrow. At this point expect that she can go home and return to outpt PT when cleared.  Pt will benefit from skilled PT to increase their independence and safety with mobility to allow discharge to the venue listed below.       Follow Up Recommendations Outpatient PT when cleared by Dr Ellene Route to return    Equipment Recommendations  Rolling walker with 5" wheels    Recommendations for Other Services       Precautions / Restrictions Precautions Precautions: Back Required Braces or Orthoses: Spinal Brace Spinal Brace: Lumbar corset;Applied in sitting position Restrictions Weight Bearing Restrictions: No      Mobility  Bed Mobility Overal bed mobility: Needs Assistance Bed Mobility: Rolling;Sit to Sidelying Rolling: Supervision       Sit to sidelying: Supervision General bed mobility comments: vc's for technique  Transfers Overall transfer level: Needs assistance Equipment used: Rolling walker (2 wheeled) Transfers: Sit to/from Stand Sit to Stand: Supervision         General transfer comment: vc's for hand placement, no LOB  Ambulation/Gait Ambulation/Gait assistance: Supervision Gait Distance (Feet): 200 Feet Assistive device: Rolling walker (2 wheeled) Gait Pattern/deviations: Step-through pattern Gait  velocity: decreased Gait velocity interpretation: 1.31 - 2.62 ft/sec, indicative of limited community ambulator General Gait Details: worked on standing with erect posture during gait (tends to have forward head). Offered her to ambulate without RW but she felt more safe with it, on pain meds.   Stairs Stairs: Yes Stairs assistance: Min guard Stair Management: One rail Right;Step to pattern;Forwards Number of Stairs: 4 General stair comments: no difficulty with stairs  Wheelchair Mobility    Modified Rankin (Stroke Patients Only)       Balance Overall balance assessment: Mild deficits observed, not formally tested                                           Pertinent Vitals/Pain Pain Assessment: Faces Faces Pain Scale: Hurts a Demedeiros bit Pain Location: back Pain Descriptors / Indicators: Operative site guarding;Sore Pain Intervention(s): Limited activity within patient's tolerance;Monitored during session;Premedicated before session    [place] expects to be discharged to:: Private residence Living Arrangements: Spouse/significant other Available Help at Discharge: Family;Available 24 hours/day Type of Home: House Home Access: Stairs to enter Entrance Stairs-Rails: Right Entrance Stairs-Number of Steps: 3 Home Layout: One level Home Equipment: None      Prior Function Level of Independence: Independent         Comments: likes to travel and play golf     Hand Dominance        Extremity/Trunk Assessment   Upper Extremity Assessment Upper Extremity Assessment: Overall WFL for tasks assessed    Lower Extremity Assessment Lower Extremity Assessment:  Overall [facility] for tasks assessed    Cervical / Trunk Assessment Cervical / Trunk Assessment: Kyphotic  Communication   Communication: No difficulties  Cognition Arousal/Alertness: Awake/alert Behavior During Therapy: WFL for tasks assessed/performed Overall Cognitive Status:  Within Functional Limits for tasks assessed                                        General Comments      Exercises     Assessment/Plan    PT Assessment Patient needs continued PT services  PT Problem List Decreased balance;Decreased mobility;Decreased activity tolerance;Decreased knowledge of use of DME;Decreased knowledge of precautions;Pain       PT Treatment Interventions DME instruction;Gait training;Functional mobility training;Therapeutic activities;Therapeutic exercise;Balance training;Neuromuscular re-education;Patient/family education;Stair training    PT Goals (Current goals can be found in the Care Plan section)  Acute Rehab PT Goals Patient Stated Goal: return to home and traveling PT Goal Formulation: With patient Time For Goal Achievement: 01/20/19 Potential to Achieve Goals: Good    Frequency Min 5X/week   Barriers to discharge        Co-evaluation               AM-PAC PT "6 Clicks" Mobility  Outcome Measure Help needed turning from your back to your side while in a flat bed without using bedrails?: A Petro Help needed moving from lying on your back to sitting on the side of a flat bed without using bedrails?: A Swor Help needed moving to and from a bed to a chair (including a wheelchair)?: A Fils Help needed standing up from a chair using your arms (e.g., wheelchair or bedside chair)?: A Lovejoy Help needed to walk in hospital room?: A Geer Help needed climbing 3-5 steps with a railing? : A K[place]oka 6 Click Score: 18    End of Session Equipment Utilized During Treatment: Gait belt;Back brace Activity Tolerance: Patient tolerated treatment well Patient left: in bed;with call bell/phone within reach;with family/visitor present Nurse Communication: Mobility status PT Visit Diagnosis: Pain;Difficulty in walking, not elsewhere classified (R26.2) Pain - Right/Left: Right Pain - part of body: Leg(back)    Time: 7169-6789 PT  Time Calculation (min) (ACUTE ONLY): 42 min   Charges:   PT Evaluation $PT Eval Low Complexity: 1 Low PT Treatments $Gait Training: 8-22 mins $Neuromuscular Re-education: 8-22 mins        Leighton Roach, PT  Acute Rehab Services  Pager [phone] Office [place] 01/13/2019, 10:52 AM

## 2019-01-13 NOTE — Progress Notes (Signed)
Slight bump in the creatinine to 1.2 from 1.0Patient ID: Katherine Gates, female   DOB: 1933-04-06, 85 y.o.   MRN: [id number] Vital signs are stable Patient is doing extremely well postoperatively She is using some moderate pain medication but has been ambulating up and down stairs I noted a slight bump in the creatinine from 1.0-1.2.  We will stop the Toradol.  Continues to progress with physical therapy.

## 2019-01-14 LAB — URINALYSIS, ROUTINE W REFLEX MICROSCOPIC
Bilirubin Urine: NEGATIVE
Glucose, UA: NEGATIVE mg/dL
Hgb urine dipstick: NEGATIVE
Ketones, ur: NEGATIVE mg/dL
Nitrite: NEGATIVE
Protein, ur: NEGATIVE mg/dL
Specific Gravity, Urine: 1.014 (ref 1.005–1.030)
pH: 5 (ref 5.0–8.0)

## 2019-01-14 MED ORDER — MAGNESIUM HYDROXIDE 400 MG/5ML PO SUSP
30.0000 mL | Freq: Once | ORAL | Status: DC
  Filled 2019-01-14: qty 30

## 2019-01-14 NOTE — Progress Notes (Signed)
Physical Therapy Treatment Patient Details Name: Katherine Gates MRN: [id number] DOB: Oct 12, 1933 Today's Date: 01/14/2019    History of Present Illness Pt presents with back pain into buttocks and down both legs, R>L. PMH: breast cancer. Underwent L3-5 PLIF.    PT Comments    Reinforced all back education incl transitions sit to/from sidelying, log roll, lifting restrictions, bracing issues and progression of activity.  Emphasis also on getting to/from EOB without the rail, working on [name], transfers, gait and stair training.    Follow Up Recommendations  No PT follow up     Equipment Recommendations  Rolling walker with 5" wheels    Recommendations for Other Services       Precautions / Restrictions Precautions Precautions: Back Precaution Booklet Issued: Yes (comment) Required Braces or Orthoses: Spinal Brace Spinal Brace: Lumbar corset;Applied in sitting position Restrictions Weight Bearing Restrictions: No Other Position/Activity Restrictions: pt educated on proper application of brace    Mobility  Bed Mobility Overal bed mobility: Needs Assistance Bed Mobility: Rolling;Sidelying to Sit;Sit to Sidelying Rolling: Supervision Sidelying to sit: Supervision     Sit to sidelying: Supervision General bed mobility comments: VCs for proper transfer technique within precautions   Transfers Overall transfer level: Needs assistance Equipment used: Rolling walker (2 wheeled) Transfers: Sit to/from Stand Sit to Stand: Supervision         General transfer comment: vc's for hand placement, correct posture   Ambulation/Gait Ambulation/Gait assistance: Supervision Gait Distance (Feet): 300 Feet Assistive device: Rolling walker (2 wheeled) Gait Pattern/deviations: Step-through pattern Gait velocity: decreased Gait velocity interpretation: >2.62 ft/sec, indicative of community ambulatory General Gait Details: pt required VC for decreased UE reliance and  proper posture, pt ambulated ~10 ft without device but pt requested RW   Stairs Stairs: Yes Stairs assistance: Min guard Stair Management: One rail Right;Step to pattern;Forwards Number of Stairs: 3 General stair comments: no difficulty with stairs   Wheelchair Mobility    Modified Rankin (Stroke Patients Only)       Balance Overall balance assessment: Mild deficits observed, not formally tested                                          Cognition Arousal/Alertness: Awake/alert Behavior During Therapy: WFL for tasks assessed/performed Overall Cognitive Status: Within Functional Limits for tasks assessed                                        Exercises      General Comments        Pertinent Vitals/Pain Pain Assessment: Faces Pain Score: 2  Faces Pain Scale: Hurts Mcclaine more Pain Location: back Pain Descriptors / Indicators: Discomfort;Sore Pain Intervention(s): Monitored during session;Premedicated before session    Home Living                      Prior Function            PT Goals (current goals can now be found in the care plan section) Acute Rehab PT Goals Patient Stated Goal: return to home and traveling PT Goal Formulation: With patient Time For Goal Achievement: 01/20/19 Potential to Achieve Goals: Good Progress towards PT goals: Progressing toward goals    Frequency    Min 5X/week  PT Plan Current plan remains appropriate    Co-evaluation              AM-PAC PT "6 Clicks" Mobility   Outcome Measure  Help needed turning from your back to your side while in a flat bed without using bedrails?: [name] Help needed moving from lying on your back to sitting on the side of a flat bed without using bedrails?: [name] Help needed moving to and from a bed to a chair (including a wheelchair)?: [name] Help needed standing up from a chair using your arms (e.g., wheelchair or bedside chair)?:  [name] Help needed to walk in hospital room?: [name] Help needed climbing 3-5 steps with a railing? : [name] 6 Click Score: 18    End of Session Equipment Utilized During Treatment: Back brace Activity Tolerance: Patient tolerated treatment well Patient left: in bed;with family/visitor present;with call bell/phone within reach Nurse Communication: Mobility status PT Visit Diagnosis: Pain;Difficulty in walking, not elsewhere classified (R26.2) Pain - Right/Left: Right     Time: 6599-3570 PT Time Calculation (min) (ACUTE ONLY): 22 min  Charges:  $Gait Training: 8-22 mins                     01/14/2019  Katherine Gates, PT Acute Rehabilitation Services [phone]  (pager) [phone]  (office)   Katherine Gates 01/14/2019, 5:11 PM

## 2019-01-14 NOTE — Progress Notes (Signed)
Pt noted to spike a temp of 100.1 orally; incentive spirometer education reinforced with deep breathing and coughing exercises; pt given prn tylenol and pt denies any chills or fatigue. Pt back to bed with call light within reach. Will closely monitor and report off to oncoming RN. Delia Heady RN

## 2019-01-14 NOTE — Progress Notes (Signed)
Occupational Therapy Treatment Patient Details Name: Katherine Gates MRN: [id number] DOB: [birth year] Today's Date: 01/14/2019    History of present illness Pt presents with back pain into buttocks and down both legs, R>L. PMH: breast cancer. Underwent L3-5 PLIF.   OT comments  Session focused on adherence to back precautions and bathing/dressing at shower level. Pt making great progress toward OT goals this session, completing ADLs with min guard. Education provided throughout re safety awareness and energy conservation strategies. D/c plan remains appropriate.   Follow Up Recommendations  No OT follow up;Supervision - Intermittent    Equipment Recommendations  3 in 1 bedside commode    Recommendations for Other Services      Precautions / Restrictions Precautions Precautions: Back Required Braces or Orthoses: Spinal Brace Spinal Brace: Lumbar corset;Applied in sitting position Restrictions Weight Bearing Restrictions: No       Mobility Bed Mobility Overal bed mobility: Needs Assistance Bed Mobility: Sidelying to Sit;Rolling Rolling: Supervision Sidelying to sit: Supervision     Sit to sidelying: Supervision General bed mobility comments: vc's for technique  Transfers Overall transfer level: Needs assistance Equipment used: None Transfers: Sit to/from Stand Sit to Stand: Supervision         General transfer comment: vc's for hand placement, no LOB    Balance Overall balance assessment: Mild deficits observed, not formally tested                                         ADL either performed or assessed with clinical judgement   ADL Overall ADL's : Needs assistance/impaired     Grooming: Supervision/safety;Standing   Upper Body Bathing: Set up;Sitting   Lower Body Bathing: Min guard;Cueing for safety   Upper Body Dressing : Min guard;Cueing for safety Upper Body Dressing Details (indicate cue type and reason): with brace Lower Body  Dressing: Min guard;Cueing for safety;Sit to/from stand Lower Body Dressing Details (indicate cue type and reason): Education provided on potential use of AE/AD, and sitting to dress for safety with balance deficits  Toilet Transfer: Min guard;Ambulation   Toileting- Clothing Manipulation and Hygiene: Min guard;Cueing for safety       Functional mobility during ADLs: Min guard                 Cognition Arousal/Alertness: Awake/alert Behavior During Therapy: WFL for tasks assessed/performed Overall Cognitive Status: Within Functional Limits for tasks assessed                                                     Pertinent Vitals/ Pain       Pain Assessment: No/denies pain         Frequency  Min 3X/week        Progress Toward Goals  OT Goals(current goals can now be found in the care plan section)  Progress towards OT goals: Progressing toward goals  Acute Rehab OT Goals Patient Stated Goal: return to home and traveling OT Goal Formulation: With patient Time For Goal Achievement: 01/27/19 Potential to Achieve Goals: Good  Plan Discharge plan remains appropriate       AM-PAC OT "6 Clicks" Daily Activity     Outcome Measure   Help from another person eating meals?: None Help  from another person taking care of personal grooming?: A Dowlen Help from another person toileting, which includes using toliet, bedpan, or urinal?: A Nusz Help from another person bathing (including washing, rinsing, drying)?: A Lorman Help from another person to put on and taking off regular upper body clothing?: A Ditmer Help from another person to put on and taking off regular lower body clothing?: A Dubree 6 Click Score: 19    End of Session Equipment Utilized During Treatment: Back brace  OT Visit Diagnosis: Unsteadiness on feet (R26.81);Pain   Activity Tolerance Patient tolerated treatment well   Patient Left in bed;with call bell/phone within reach;with  family/visitor present   Nurse Communication Mobility status        Time: 3354-5625 OT Time Calculation (min): 28 min  Charges: OT General Charges $OT Visit: 1 Visit OT Treatments $Self Care/Home Management : 23-37 mins   Curtis Sites OTR/L  01/14/2019, 10:59 AM

## 2019-01-14 NOTE — Progress Notes (Signed)
Patient ID: Katherine Gates, female   DOB: [birth year], 85 y.o.   MRN: [id number] Vital signs are stable currently but patient has had some febrile episodes.  She has not had a bowel movement in today I advised the use of some milk of magnesia.  Urinalysis is being checked.  Overall the patient is doing quite well and she is ambulated with physical therapy and Occupational Therapy.  We are preparing her for discharge tomorrow all pending on how she does with her fever.

## 2019-01-15 MED ORDER — CIPROFLOXACIN HCL 500 MG PO TABS: 500.0000 mg | ORAL_TABLET | Freq: Two times a day (BID) | ORAL | 0 refills | Status: DC

## 2019-01-15 MED ORDER — OXYCODONE-ACETAMINOPHEN 5-325 MG PO TABS: 1.0000 | ORAL_TABLET | ORAL | 0 refills | Status: DC | PRN

## 2019-01-15 MED ORDER — METHOCARBAMOL 500 MG PO TABS: 500.0000 mg | ORAL_TABLET | Freq: Four times a day (QID) | ORAL | 3 refills | Status: DC | PRN

## 2019-01-15 NOTE — Discharge Summary (Signed)
Physician Discharge Summary  Patient ID: DESLYN CAVENAUGH MRN: [id number] DOB/AGE: May 21, 1933 85 y.o.  Admit date: 01/12/2019 Discharge date: 01/15/2019  Admission Diagnoses: Lumbar spondylolisthesis and stenosis L3-4 L4-5, neurogenic claudication.  Discharge Diagnoses: Lumbar spondylolisthesis and stenosis L3-4 L4-5, neurogenic claudication, lumbar radiculopathy.  Urinary tract infection, acute kidney injury Active Problems:   Spondylolisthesis at L3-L4 level   Discharged Condition: good  Hospital Course: Patient was admitted to undergo surgical decompression and stabilization L3-4 and L4-5.  She tolerated surgery well.  Persistent low-grade temperature and was found to have a mild urinary tract infection.  Is also noted that she had a slight bump in her creatinine from 1.0-1.2.  Consults: None  Significant Diagnostic Studies: None  Treatments: surgery: Lumbar laminectomy L3 and L4 with decompression of L3-L4 and L5 nerve roots and, dural tube posterior lumbar interbody arthrodesis L3-4 and L4-5 segmental fixation L3-L5 with posterior lateral arthrodesis.  Discharge Exam: Blood pressure (!) 142/57, pulse 83, temperature 99.9 F (37.7 C), temperature source Oral, resp. rate 18, height 5\' 2"  (1.575 m), weight 73.5 kg, SpO2 92 %. Incision is clean and dry.  Moderate ecchymosis around the incision.  Station and gait are intact.  Disposition: Discharge disposition: 01-Home or Self Care       Discharge Instructions    Diet - low sodium heart healthy   Complete by:  As directed    Incentive spirometry RT   Complete by:  As directed    Increase activity slowly   Complete by:  As directed         Signed: Earleen Newport 01/15/2019, 7:43 AM

## 2019-01-15 NOTE — Care Management Note (Signed)
Case Management Note  Patient Details  Name: Katherine Gates MRN: [id number] Date of Birth: December 01, 1933  Subjective/Objective:     Pt s/p lumbar surgery. She is from home with spouse that can provide assistance and supervision.  DME: none No issues obtaining home meds. Spouse to provide transportation.               Action/Plan: Pt discharging home with self care. No f/u per PT/OT. Pt with orders for walker and 3in 1. James with [facility] DME notified and will deliver to the room. Spouse transporting home.   Expected Discharge Date:  01/15/19               Expected Discharge Plan:  Home/Self Care  In-House Referral:     Discharge planning Services  CM Consult  Post Acute Care Choice:  Durable Medical Equipment Choice offered to:  Patient, Spouse  DME Arranged:  3-N-1, Walker rolling DME Agency:  [place]:    Lares:     Status of Service:  Completed, signed off  If discussed at [place] of Stay Meetings, dates discussed:    Additional Comments:  Pollie Friar, RN 01/15/2019, 11:30 AM

## 2019-01-19 MED FILL — Sodium Chloride IV Soln 0.9%: INTRAVENOUS | Qty: 1000 | Status: AC

## 2019-01-19 MED FILL — Heparin Sodium (Porcine) Inj 1000 Unit/ML: INTRAMUSCULAR | Qty: 30 | Status: AC

## 2019-04-09 ENCOUNTER — Inpatient Hospital Stay: Payer: Medicare Other | Admitting: Oncology

## 2019-04-09 ENCOUNTER — Ambulatory Visit: Payer: Medicare Other | Admitting: Radiation Oncology

## 2019-07-05 NOTE — Progress Notes (Signed)
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  INTERVAL HISTORY: Patient returns to clinic today for routine 75-monthevaluation.  She continues to have chronic back pain recently had surgery in February and now states she is having surgery on her right shoulder.  She continues to have occasional hot flashes, but these are significantly improved since discontinuing Aromasin.  She has no neurologic complaints.  She denies any recent fevers or illnesses.  She has a good appetite and denies weight loss.  She denies any chest pain, shortness of breath, cough, or hemoptysis.  She denies any nausea, vomiting, constipation, or diarrhea. She has no urinary complaints.  Patient offers no further specific complaints today.  REVIEW OF SYSTEMS:   Review of Systems  Constitutional: Negative.  Negative for fever, malaise/fatigue and weight loss.  Respiratory: Negative.  Negative for cough and shortness of breath.   Cardiovascular: Negative.  Negative for chest pain and leg swelling.  Gastrointestinal: Negative.  Negative for abdominal pain.  Genitourinary: Negative.  Negative for dysuria.  Musculoskeletal: Positive for back pain and joint pain.  Skin: Negative.  Negative for itching and rash.  Neurological: Negative.  Negative for sensory change, focal weakness and weakness.  Psychiatric/Behavioral: Negative.  The patient is not nervous/anxious and does not have insomnia.     As per HPI. Otherwise, a complete review of systems is negative.  PAST MEDICAL HISTORY: Past Medical History:  Diagnosis Date  . Bell's palsy   . Breast cancer ([place] 2017   right  . Dermatitis contact, eyelid   . Environmental  allergies   . Fibrocystic breast disease    [facility]  . GERD (gastroesophageal reflux disease)   . HOH (hard of hearing)   . IBS (irritable bowel syndrome)   . Melanoma of skin ([place] 2011   left upper arm  . Neuropathy    JUST IN LEGS  . Osteoarthritis   . Personal history of radiation therapy   . Rosacea     PAST SURGICAL HISTORY: Past Surgical History:  Procedure Laterality Date  . ABDOMINAL HYSTERECTOMY    . BREAST BIOPSY Right 02/29/2016   +  . BREAST EXCISIONAL BIOPSY Right 03/2016   + rad  . ENDOSCOPIC CARPAL TUNNEL RELEASE Right   . EYE SURGERY    . eyelid lift    . PARTIAL MASTECTOMY WITH NEEDLE LOCALIZATION Right 03/16/2016   Procedure: PARTIAL MASTECTOMY;  Surgeon: JLeonie Green MD;  Location: [facility] ORS;  Service: General;  Laterality: Right;  . repair tear ducts    . SENTINEL NODE BIOPSY Right 03/16/2016   Procedure: SENTINEL LYMPH NODE BIOPSY;  Surgeon: JLeonie Green MD;  Location: [facility] ORS;  Service: General;  Laterality: Right;    FAMILY HISTORY Family History  Problem Relation Age of Onset  . Skin cancer Brother        non-melanoma  . Skin cancer Father        non-melanoma  . Throat cancer Father   . Stroke Mother   . Breast cancer Neg Hx        ADVANCED DIRECTIVES:    HEALTH MAINTENANCE: Social History   Tobacco Use  . Smoking status: Never Smoker  . Smokeless tobacco: Never Used  Substance Use Topics  . Alcohol use: No    Alcohol/week: 0.0 standard drinks  . Drug use: No     Colonoscopy:  PAP:  Bone density:  Lipid panel:  Allergies  Allergen Reactions  . Benzalkonium Chloride Other (See Comments)    Positive patch test  . Codeine Nausea And Vomiting  . Garlic Diarrhea  . Methylisothiazolinone Other (See Comments)    Positive patch test     Current Outpatient Medications  Medication Sig Dispense Refill  . fexofenadine (ALLEGRA) 180 MG tablet Take 180 mg by mouth daily.     . fluticasone (FLONASE)  50 MCG/ACT nasal spray Place 2 sprays into both nostrils daily.     . metroNIDAZOLE (METROGEL) 0.75 % gel Apply 1 application topically 2 (two) times daily.     . pantoprazole (PROTONIX) 40 MG tablet Take 40 mg by mouth 2 (two) times daily.     Marland Kitchen Propylene Glycol (SYSTANE BALANCE OP) Place 1 drop into both eyes 2 (two) times daily as needed (dry eyes).     . vitamin B-12 (CYANOCOBALAMIN) 500 MCG tablet Take 500 mcg by mouth daily.    Marland Kitchen aspirin EC 81 MG tablet Take 81 mg by mouth daily.     Marland Kitchen oxyCODONE-acetaminophen (PERCOCET/ROXICET) 5-325 MG tablet Take 1-2 tablets by mouth every 3 (three) hours as needed for moderate pain or severe pain. 60 tablet 0   No current facility-administered medications for this visit.     OBJECTIVE: Vitals:   07/09/19 1435  BP: (!) 145/75  Pulse: 69  Resp: 18  Temp: 98.4 F (36.9 C)     Body mass index is 28.99 kg/m.    ECOG FS:0 - Asymptomatic  General: Well-developed, well-nourished, no acute distress. Eyes: Pink conjunctiva, anicteric sclera. HEENT: Normocephalic, moist mucous membranes. Breast: Exam performed by another provider earlier today. Lungs: Clear to auscultation bilaterally. Heart: Regular rate and rhythm. No rubs, murmurs, or gallops. Abdomen: Soft, nontender, nondistended. No organomegaly noted, normoactive bowel sounds. Musculoskeletal: No edema, cyanosis, or clubbing. Neuro: Alert, answering all questions appropriately. Cranial nerves grossly intact. Skin: No rashes or petechiae noted. Psych: Normal affect.  LAB RESULTS:  Lab Results  Component Value Date   NA 139 01/13/2019   K 4.0 01/13/2019   CL 106 01/13/2019   CO2 25 01/13/2019   GLUCOSE 140 (H) 01/13/2019   BUN 21 01/13/2019   CREATININE 1.21 (H) 01/13/2019   CALCIUM 8.6 (L) 01/13/2019   PROT 7.0 03/08/2016   ALBUMIN 3.8 03/08/2016   AST 59 (H) 03/08/2016   ALT 41 03/08/2016   ALKPHOS 74 03/08/2016   BILITOT 0.5 03/08/2016   GFRNONAA 41 (L) 01/13/2019   GFRAA 48  (L) 01/13/2019    Lab Results  Component Value Date   WBC 14.0 (H) 01/13/2019   NEUTROABS 5.3 03/08/2016   HGB 10.6 (L) 01/13/2019   HCT 32.8 (L) 01/13/2019   MCV 92.9 01/13/2019   PLT 230 01/13/2019      STUDIES: No results found.  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 Mammoprint low risk.  PLAN:    1. 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 Patient completed adjuvant XRT in August 2017.  Patient discontinued both letrozole and Aromasin secondary to a significant side effects and only completed approximately 2 years of aromatase inhibitor treatment.  Patient expressed understanding that her risk of recurrence may increase by not completing 5 years of treatment.  No further intervention is needed at this time.  Her most recent mammogram on December 29, 2018 was reported as BI-RADS 2, repeat in January 2021.  Return to clinic in 6 months for routine evaluation at which time she likely can be transitioned to yearly evaluation.    2.  Osteopenia: Bone marrow density completed on August 04, 2018 reported T score of -1.3.  This is slightly worse than 2 years prior when her T score was normal at -0.5.  Have recommended patient take calcium and vitamin D supplementation.  Continue monitoring per primary care. 3.  Shoulder pain: Patient reports she has surgery in the next several weeks.   Patient expressed understanding and was in agreement with this plan. She also understands that She can call clinic at any time with any questions, concerns, or complaints.    Lloyd Huger, MD   07/10/2019 7:05 AM

## 2019-07-09 ENCOUNTER — Inpatient Hospital Stay: Payer: Medicare Other | Attending: Oncology | Admitting: Oncology

## 2019-07-09 ENCOUNTER — Ambulatory Visit
Admission: RE | Admit: 2019-07-09 | Discharge: 2019-07-09 | Disposition: A | Discharge status: Home / Self Care | Payer: Medicare Other | Source: Ambulatory Visit | Attending: Radiation Oncology | Admitting: Radiation Oncology

## 2019-07-09 ENCOUNTER — Ambulatory Visit: Payer: Self-pay | Admitting: Orthopedic Surgery

## 2019-07-09 ENCOUNTER — Encounter: Payer: Self-pay | Admitting: Radiation Oncology

## 2019-07-09 ENCOUNTER — Other Ambulatory Visit: Payer: Self-pay

## 2019-07-09 ENCOUNTER — Encounter: Payer: Self-pay | Admitting: Oncology

## 2019-07-09 VITALS — BP 165/74 | HR 80 | Temp 98.4°F | Resp 18 | Wt 158.5 lb

## 2019-07-09 VITALS — BP 145/75 | HR 69 | Temp 98.4°F | Resp 18 | Wt 158.5 lb

## 2019-07-09 DIAGNOSIS — Z853 Personal history of malignant neoplasm of breast: Secondary | ICD-10-CM | POA: Diagnosis present

## 2019-07-09 DIAGNOSIS — K219 Gastro-esophageal reflux disease without esophagitis: Secondary | ICD-10-CM | POA: Diagnosis not present

## 2019-07-09 DIAGNOSIS — Z9223 Personal history of estrogen therapy: Secondary | ICD-10-CM | POA: Diagnosis not present

## 2019-07-09 DIAGNOSIS — R232 Flushing: Secondary | ICD-10-CM | POA: Insufficient documentation

## 2019-07-09 DIAGNOSIS — M8580 Other specified disorders of bone density and structure, unspecified site: Secondary | ICD-10-CM

## 2019-07-09 DIAGNOSIS — C50111 Malignant neoplasm of central portion of right female breast: Secondary | ICD-10-CM

## 2019-07-09 DIAGNOSIS — C50011 Malignant neoplasm of nipple and areola, right female breast: Secondary | ICD-10-CM

## 2019-07-09 DIAGNOSIS — Z923 Personal history of irradiation: Secondary | ICD-10-CM | POA: Insufficient documentation

## 2019-07-09 DIAGNOSIS — Z79899 Other long term (current) drug therapy: Secondary | ICD-10-CM | POA: Insufficient documentation

## 2019-07-09 DIAGNOSIS — M25519 Pain in unspecified shoulder: Secondary | ICD-10-CM | POA: Diagnosis not present

## 2019-07-09 DIAGNOSIS — M549 Dorsalgia, unspecified: Secondary | ICD-10-CM | POA: Insufficient documentation

## 2019-07-09 DIAGNOSIS — Z7982 Long term (current) use of aspirin: Secondary | ICD-10-CM | POA: Insufficient documentation

## 2019-07-09 DIAGNOSIS — G8929 Other chronic pain: Secondary | ICD-10-CM

## 2019-07-09 DIAGNOSIS — Z170 Estrogen receptor positive status [ER+]: Secondary | ICD-10-CM | POA: Insufficient documentation

## 2019-07-09 DIAGNOSIS — Z85820 Personal history of malignant melanoma of skin: Secondary | ICD-10-CM | POA: Insufficient documentation

## 2019-07-09 NOTE — Progress Notes (Signed)
Radiation Oncology Follow up Note  Name: Katherine Gates   Date:   07/09/2019 MRN:  [id number] DOB: 03/11/1933    This 85 y.o. female presents to the clinic today for 2-1/2-year follow-up status post whole breast radiation to her right breast for stage I ER PR positive invasive mammary carcinoma.  REFERRING PROVIDER: Idelle Crouch, MD  HPI: Patient is an 85 year old female now seen at over 2 and half years having completed whole breast radiation to her right breast for stage I ER PR positive invasive mammary carcinoma.  Seen today in routine follow-up she is doing well.  She specifically denies breast tenderness cough or bone pain..  Patient has discontinued her Aromasin based on hot flashes.  She had mammograms back in January which I reviewed were BI-RADS 2 benign she has had laminectomy with decompression of L3 and L4 from which she is slowly recovering.  She also is scheduled for surgery next week and arthroscopic on her right shoulder.  COMPLICATIONS OF TREATMENT: none  FOLLOW UP COMPLIANCE: keeps appointments   PHYSICAL EXAM:  BP (!) 165/74   Pulse 80   Temp 98.4 F (36.9 C)   Resp 18   Wt 158 lb 8 oz (71.9 kg)   BMI 28.99 kg/m  Lungs are clear to A&P cardiac examination essentially unremarkable with regular rate and rhythm. No dominant mass or nodularity is noted in either breast in 2 positions examined. Incision is well-healed. No axillary or supraclavicular adenopathy is appreciated. Cosmetic result is excellent.  Well-developed well-nourished patient in NAD. HEENT reveals PERLA, EOMI, discs not visualized.  Oral cavity is clear. No oral mucosal lesions are identified. Neck is clear without evidence of cervical or supraclavicular adenopathy. Lungs are clear to A&P. Cardiac examination is essentially unremarkable with regular rate and rhythm without murmur rub or thrill. Abdomen is benign with no organomegaly or masses noted. Motor sensory and DTR levels are equal and symmetric in  the upper and lower extremities. Cranial nerves II through XII are grossly intact. Proprioception is intact. No peripheral adenopathy or edema is identified. No motor or sensory levels are noted. Crude visual fields are within normal range.  RADIOLOGY RESULTS: Mammograms reviewed compatible with above-stated findings  PLAN: Present time patient continues to do well with no evidence of disease 2-1/2 years out.  I am pleased with her overall progress.  I did express concern she is not on antiestrogen therapy.  She is seeing Dr. Grayland Ormond today.  I have asked to see her back in 1 year for follow-up.  Patient knows to call with any concerns.  I would like to take this opportunity to thank you for allowing me to participate in the care of your patient.Noreene Filbert, MD

## 2019-07-09 NOTE — Progress Notes (Signed)
Pt doing well, reports had back surgery in Feb and going to have right shoulder surgery in August.  Pt had breast exam during visit with Dr Baruch Gouty today.

## 2019-07-10 ENCOUNTER — Other Ambulatory Visit
Admission: RE | Admit: 2019-07-10 | Discharge: 2019-07-10 | Disposition: A | Discharge status: Home / Self Care | Payer: Medicare Other | Source: Ambulatory Visit | Attending: Orthopedic Surgery | Admitting: Orthopedic Surgery

## 2019-07-10 DIAGNOSIS — Z20828 Contact with and (suspected) exposure to other viral communicable diseases: Secondary | ICD-10-CM | POA: Diagnosis present

## 2019-07-11 LAB — SARS CORONAVIRUS 2 (TAT 6-24 HRS): SARS Coronavirus 2: NEGATIVE

## 2019-07-13 ENCOUNTER — Other Ambulatory Visit: Payer: Self-pay

## 2019-07-13 ENCOUNTER — Encounter
Admission: RE | Admit: 2019-07-13 | Discharge: 2019-07-13 | Disposition: A | Discharge status: Home / Self Care | Payer: Medicare Other | Source: Ambulatory Visit | Attending: Orthopedic Surgery | Admitting: Orthopedic Surgery

## 2019-07-13 DIAGNOSIS — Z8669 Personal history of other diseases of the nervous system and sense organs: Secondary | ICD-10-CM | POA: Diagnosis not present

## 2019-07-13 DIAGNOSIS — M1990 Unspecified osteoarthritis, unspecified site: Secondary | ICD-10-CM | POA: Diagnosis not present

## 2019-07-13 DIAGNOSIS — K589 Irritable bowel syndrome without diarrhea: Secondary | ICD-10-CM | POA: Diagnosis not present

## 2019-07-13 DIAGNOSIS — N6019 Diffuse cystic mastopathy of unspecified breast: Secondary | ICD-10-CM | POA: Diagnosis not present

## 2019-07-13 DIAGNOSIS — M75121 Complete rotator cuff tear or rupture of right shoulder, not specified as traumatic: Secondary | ICD-10-CM | POA: Diagnosis not present

## 2019-07-13 DIAGNOSIS — Z85820 Personal history of malignant melanoma of skin: Secondary | ICD-10-CM | POA: Diagnosis not present

## 2019-07-13 DIAGNOSIS — Z853 Personal history of malignant neoplasm of breast: Secondary | ICD-10-CM | POA: Diagnosis not present

## 2019-07-13 DIAGNOSIS — K219 Gastro-esophageal reflux disease without esophagitis: Secondary | ICD-10-CM | POA: Diagnosis not present

## 2019-07-13 DIAGNOSIS — Z79899 Other long term (current) drug therapy: Secondary | ICD-10-CM | POA: Diagnosis not present

## 2019-07-13 LAB — BASIC METABOLIC PANEL
Anion gap: 9 (ref 5–15)
BUN: 18 mg/dL (ref 8–23)
CO2: 26 mmol/L (ref 22–32)
Calcium: 9.5 mg/dL (ref 8.9–10.3)
Chloride: 104 mmol/L (ref 98–111)
Creatinine, Ser: 0.98 mg/dL (ref 0.44–1.00)
GFR calc Af Amer: >60 mL/min (ref >60)
GFR calc non Af Amer: 53 mL/min — ABNORMAL LOW (ref >60)
Glucose, Bld: 125 mg/dL — ABNORMAL HIGH (ref 70–99)
Potassium: 4 mmol/L (ref 3.5–5.1)
Sodium: 139 mmol/L (ref 135–145)

## 2019-07-13 LAB — CBC
HCT: 41.3 % (ref 36.0–46.0)
Hemoglobin: 13.2 g/dL (ref 12.0–15.0)
MCH: 28.8 pg (ref 26.0–34.0)
MCHC: 32 g/dL (ref 30.0–36.0)
MCV: 90.2 fL (ref 80.0–100.0)
Platelets: 264 10*3/uL (ref 150–400)
RBC: 4.58 MIL/uL (ref 3.87–5.11)
RDW: 14.4 % (ref 11.5–15.5)
WBC: 8.3 10*3/uL (ref 4.0–10.5)
nRBC: 0 % (ref 0.0–0.2)

## 2019-07-13 LAB — PROTIME-INR
INR: 1 (ref 0.8–1.2)
Prothrombin Time: 12.8 seconds (ref 11.4–15.2)

## 2019-07-13 LAB — APTT: aPTT: 31 seconds (ref 24–36)

## 2019-07-13 NOTE — Patient Instructions (Signed)
Your procedure is scheduled on: Wednesday, July 15, 2019 Report to Day Surgery on the 2nd floor of the [facility]. To find out your arrival time, please call [phone] between 1PM - 3PM on: Tuesday, August 4  REMEMBER: Instructions that are not followed completely may result in serious medical risk, up to and including death; or upon the discretion of your surgeon and anesthesiologist your surgery may need to be rescheduled.  Do not eat food after midnight the night before surgery.  No gum chewing, lozengers or hard candies.  You may however, drink CLEAR liquids up to 2 hours before you are scheduled to arrive for your surgery. Do not drink anything within 2 hours of the start of your surgery.  Clear liquids include: - water  - apple juice without pulp - gatorade - black coffee or tea (Do NOT add milk or creamers to the coffee or tea) Do NOT drink anything that is not on this list.  ENSURE PRE-SURGERY CARBOHYDRATE DRINK:  Complete drinking 3 hours prior to leaving for the hospital.  No Alcohol for 24 hours before or after surgery.  No Smoking including e-cigarettes for 24 hours prior to surgery.  No chewable tobacco products for at least 6 hours prior to surgery.  No nicotine patches on the day of surgery.  On the morning of surgery brush your teeth with toothpaste and water, you may rinse your mouth with mouthwash if you wish. Do not swallow any toothpaste or mouthwash.  Notify your doctor if there is any change in your medical condition (cold, fever, infection).  Do not wear jewelry, make-up, hairpins, clips or nail polish.  Do not wear lotions, powders, or perfumes.   Do not shave 48 hours prior to surgery.   Contacts and dentures may not be worn into surgery.  Do not bring valuables to the hospital, including drivers license, insurance or credit cards.  [facility] is not responsible for any belongings or valuables.   TAKE THESE MEDICATIONS THE MORNING OF  SURGERY:  1.  Pantoprazole - (take one the night before and one on the morning of surgery - helps to prevent nausea after surgery.)  Use CHG Soap as directed on instruction sheet.  NOW!  Stop ASPIRIN AND Anti-inflammatories (NSAIDS) such as Advil, Aleve, Ibuprofen, Motrin, Naproxen, Naprosyn and Aspirin based products such as Excedrin, Goodys Powder, BC Powder. (May take Tylenol or Acetaminophen if needed.)  NOW!  Stop ANY OVER THE COUNTER supplements until after surgery. (May continue Vitamin B.)  Wear comfortable clothing (specific to your surgery type) to the hospital.  Plan for stool softeners for home use.  If you are being discharged the day of surgery, you will not be allowed to drive home. You will need a responsible adult to drive you home and stay with you that night.   If you are taking public transportation, you will need to have a responsible adult with you. Please confirm with your physician that it is acceptable to use public transportation.   Please call [phone] if you have any questions about these instructions.

## 2019-07-15 ENCOUNTER — Encounter
Admission: RE | Disposition: A | Discharge status: Home / Self Care | Payer: Self-pay | Source: Home / Self Care | Attending: Orthopedic Surgery

## 2019-07-15 ENCOUNTER — Encounter: Payer: Self-pay | Admitting: Anesthesiology

## 2019-07-15 ENCOUNTER — Ambulatory Visit: Payer: Medicare Other | Admitting: Anesthesiology

## 2019-07-15 ENCOUNTER — Other Ambulatory Visit: Payer: Self-pay

## 2019-07-15 ENCOUNTER — Ambulatory Visit
Admission: RE | Admit: 2019-07-15 | Discharge: 2019-07-15 | Disposition: A | Discharge status: Home / Self Care | Payer: Medicare Other | Attending: Orthopedic Surgery | Admitting: Orthopedic Surgery

## 2019-07-15 DIAGNOSIS — M1990 Unspecified osteoarthritis, unspecified site: Secondary | ICD-10-CM | POA: Insufficient documentation

## 2019-07-15 DIAGNOSIS — Z79899 Other long term (current) drug therapy: Secondary | ICD-10-CM | POA: Insufficient documentation

## 2019-07-15 DIAGNOSIS — Z85820 Personal history of malignant melanoma of skin: Secondary | ICD-10-CM | POA: Insufficient documentation

## 2019-07-15 DIAGNOSIS — N6019 Diffuse cystic mastopathy of unspecified breast: Secondary | ICD-10-CM | POA: Insufficient documentation

## 2019-07-15 DIAGNOSIS — M75121 Complete rotator cuff tear or rupture of right shoulder, not specified as traumatic: Secondary | ICD-10-CM | POA: Insufficient documentation

## 2019-07-15 DIAGNOSIS — K589 Irritable bowel syndrome without diarrhea: Secondary | ICD-10-CM | POA: Insufficient documentation

## 2019-07-15 DIAGNOSIS — Z8669 Personal history of other diseases of the nervous system and sense organs: Secondary | ICD-10-CM | POA: Insufficient documentation

## 2019-07-15 DIAGNOSIS — K219 Gastro-esophageal reflux disease without esophagitis: Secondary | ICD-10-CM | POA: Insufficient documentation

## 2019-07-15 DIAGNOSIS — Z853 Personal history of malignant neoplasm of breast: Secondary | ICD-10-CM | POA: Insufficient documentation

## 2019-07-15 HISTORY — PX: SHOULDER ARTHROSCOPY WITH ROTATOR CUFF REPAIR: SHX5685

## 2019-07-15 SURGERY — ARTHROSCOPY, SHOULDER, WITH ROTATOR CUFF REPAIR
Anesthesia: General | Site: Shoulder | Laterality: Right

## 2019-07-15 MED ORDER — SUCCINYLCHOLINE CHLORIDE 20 MG/ML IJ SOLN
INTRAMUSCULAR | Status: DC | PRN
  Administered 2019-07-15: 100 mg via INTRAVENOUS

## 2019-07-15 MED ORDER — ONDANSETRON HCL 4 MG/2ML IJ SOLN
INTRAMUSCULAR | Status: AC
  Filled 2019-07-15: qty 2

## 2019-07-15 MED ORDER — BUPIVACAINE HCL (PF) 0.5 % IJ SOLN
INTRAMUSCULAR | Status: DC | PRN
  Administered 2019-07-15: 10 mL

## 2019-07-15 MED ORDER — BUPIVACAINE LIPOSOME 1.3 % IJ SUSP
INTRAMUSCULAR | Status: DC | PRN
  Administered 2019-07-15: 10 mL

## 2019-07-15 MED ORDER — LACTATED RINGERS IV SOLN
INTRAVENOUS | Status: DC
  Administered 2019-07-15: 12:00:00 via INTRAVENOUS

## 2019-07-15 MED ORDER — DEXAMETHASONE SODIUM PHOSPHATE 10 MG/ML IJ SOLN
INTRAMUSCULAR | Status: AC
  Filled 2019-07-15: qty 1

## 2019-07-15 MED ORDER — PHENYLEPHRINE HCL (PRESSORS) 10 MG/ML IV SOLN
INTRAVENOUS | Status: AC
  Filled 2019-07-15: qty 1

## 2019-07-15 MED ORDER — FENTANYL CITRATE (PF) 100 MCG/2ML IJ SOLN
INTRAMUSCULAR | Status: AC
  Administered 2019-07-15: 25 ug via INTRAVENOUS
  Filled 2019-07-15: qty 2

## 2019-07-15 MED ORDER — FENTANYL CITRATE (PF) 100 MCG/2ML IJ SOLN
INTRAMUSCULAR | Status: AC
  Filled 2019-07-15: qty 2

## 2019-07-15 MED ORDER — ACETAMINOPHEN 500 MG PO TABS
ORAL_TABLET | ORAL | Status: AC
  Administered 2019-07-15: 1000 mg via ORAL
  Filled 2019-07-15: qty 2

## 2019-07-15 MED ORDER — LIDOCAINE HCL (PF) 2 % IJ SOLN
INTRAMUSCULAR | Status: AC
  Filled 2019-07-15: qty 10

## 2019-07-15 MED ORDER — LIDOCAINE HCL (PF) 1 % IJ SOLN
INTRAMUSCULAR | Status: AC
  Filled 2019-07-15: qty 5

## 2019-07-15 MED ORDER — FENTANYL CITRATE (PF) 100 MCG/2ML IJ SOLN
50.0000 ug | Freq: Once | INTRAMUSCULAR | Status: AC
  Administered 2019-07-15: 12:00:00 50 ug via INTRAVENOUS

## 2019-07-15 MED ORDER — BUPIVACAINE HCL 0.25 % IJ SOLN
INTRAMUSCULAR | Status: DC | PRN
  Administered 2019-07-15: 5 mL

## 2019-07-15 MED ORDER — ONDANSETRON HCL 4 MG/2ML IJ SOLN
INTRAMUSCULAR | Status: DC | PRN
  Administered 2019-07-15: 4 mg via INTRAVENOUS

## 2019-07-15 MED ORDER — PROPOFOL 500 MG/50ML IV EMUL
INTRAVENOUS | Status: AC
  Filled 2019-07-15: qty 50

## 2019-07-15 MED ORDER — FENTANYL CITRATE (PF) 100 MCG/2ML IJ SOLN
INTRAMUSCULAR | Status: AC
  Administered 2019-07-15: 12:00:00 50 ug via INTRAVENOUS
  Filled 2019-07-15: qty 2

## 2019-07-15 MED ORDER — ACETAMINOPHEN 500 MG PO TABS
1000.0000 mg | ORAL_TABLET | Freq: Once | ORAL | Status: AC
  Administered 2019-07-15: 1000 mg via ORAL

## 2019-07-15 MED ORDER — OXYCODONE-ACETAMINOPHEN 5-325 MG PO TABS
ORAL_TABLET | ORAL | Status: AC
  Filled 2019-07-15: qty 1

## 2019-07-15 MED ORDER — PROPOFOL 10 MG/ML IV BOLUS
INTRAVENOUS | Status: DC | PRN
  Administered 2019-07-15: 20 mg via INTRAVENOUS
  Administered 2019-07-15: 80 mg via INTRAVENOUS
  Administered 2019-07-15 (×3): 30 mg via INTRAVENOUS

## 2019-07-15 MED ORDER — ROCURONIUM BROMIDE 50 MG/5ML IV SOLN
INTRAVENOUS | Status: AC
  Filled 2019-07-15: qty 1

## 2019-07-15 MED ORDER — BUPIVACAINE HCL (PF) 0.25 % IJ SOLN
INTRAMUSCULAR | Status: AC
  Filled 2019-07-15: qty 30

## 2019-07-15 MED ORDER — ROCURONIUM BROMIDE 100 MG/10ML IV SOLN
INTRAVENOUS | Status: DC | PRN
  Administered 2019-07-15: 5 mg via INTRAVENOUS
  Administered 2019-07-15: 35 mg via INTRAVENOUS
  Administered 2019-07-15: 10 mg via INTRAVENOUS

## 2019-07-15 MED ORDER — KETOROLAC TROMETHAMINE 15 MG/ML IJ SOLN
7.5000 mg | Freq: Four times a day (QID) | INTRAMUSCULAR | Status: DC
  Administered 2019-07-15: 16:00:00 7.5 mg via INTRAVENOUS

## 2019-07-15 MED ORDER — BUPIVACAINE LIPOSOME 1.3 % IJ SUSP
INTRAMUSCULAR | Status: DC | PRN
  Administered 2019-07-15: 20 mL

## 2019-07-15 MED ORDER — LIDOCAINE HCL (CARDIAC) PF 100 MG/5ML IV SOSY
PREFILLED_SYRINGE | INTRAVENOUS | Status: DC | PRN
  Administered 2019-07-15: 60 mg via INTRAVENOUS

## 2019-07-15 MED ORDER — BUPIVACAINE LIPOSOME 1.3 % IJ SUSP
INTRAMUSCULAR | Status: AC
  Filled 2019-07-15: qty 20

## 2019-07-15 MED ORDER — BUPIVACAINE HCL (PF) 0.5 % IJ SOLN
INTRAMUSCULAR | Status: AC
  Filled 2019-07-15: qty 10

## 2019-07-15 MED ORDER — OXYCODONE-ACETAMINOPHEN 5-325 MG PO TABS
1.0000 | ORAL_TABLET | Freq: Once | ORAL | Status: AC
  Administered 2019-07-15: 1 via ORAL

## 2019-07-15 MED ORDER — GABAPENTIN 300 MG PO CAPS
300.0000 mg | ORAL_CAPSULE | Freq: Once | ORAL | Status: AC
  Administered 2019-07-15: 300 mg via ORAL

## 2019-07-15 MED ORDER — CEFAZOLIN SODIUM-DEXTROSE 2-4 GM/100ML-% IV SOLN
2.0000 g | INTRAVENOUS | Status: AC
  Administered 2019-07-15: 2 g via INTRAVENOUS

## 2019-07-15 MED ORDER — EPHEDRINE SULFATE 50 MG/ML IJ SOLN
INTRAMUSCULAR | Status: DC | PRN
  Administered 2019-07-15: 5 mg via INTRAVENOUS
  Administered 2019-07-15: 10 mg via INTRAVENOUS

## 2019-07-15 MED ORDER — CHLORHEXIDINE GLUCONATE 4 % EX LIQD: 60.0000 mL | Freq: Once | CUTANEOUS | Status: DC

## 2019-07-15 MED ORDER — SUGAMMADEX SODIUM 200 MG/2ML IV SOLN
INTRAVENOUS | Status: DC | PRN
  Administered 2019-07-15: 200 mg via INTRAVENOUS

## 2019-07-15 MED ORDER — DOCUSATE SODIUM 100 MG PO CAPS: 100.0000 mg | ORAL_CAPSULE | Freq: Every day | ORAL | 2 refills | Status: AC | PRN

## 2019-07-15 MED ORDER — KETOROLAC TROMETHAMINE 15 MG/ML IJ SOLN
INTRAMUSCULAR | Status: AC
  Administered 2019-07-15: 16:00:00 7.5 mg via INTRAVENOUS
  Filled 2019-07-15: qty 1

## 2019-07-15 MED ORDER — CELECOXIB 200 MG PO CAPS
ORAL_CAPSULE | ORAL | Status: AC
  Administered 2019-07-15: 400 mg via ORAL
  Filled 2019-07-15: qty 2

## 2019-07-15 MED ORDER — PHENYLEPHRINE HCL (PRESSORS) 10 MG/ML IV SOLN
INTRAVENOUS | Status: DC | PRN
  Administered 2019-07-15: 100 ug via INTRAVENOUS

## 2019-07-15 MED ORDER — SUGAMMADEX SODIUM 200 MG/2ML IV SOLN
INTRAVENOUS | Status: AC
  Filled 2019-07-15: qty 2

## 2019-07-15 MED ORDER — KETOROLAC TROMETHAMINE 15 MG/ML IJ SOLN
INTRAMUSCULAR | Status: AC
  Filled 2019-07-15: qty 1

## 2019-07-15 MED ORDER — ONDANSETRON HCL 4 MG/2ML IJ SOLN: 4.0000 mg | Freq: Once | INTRAMUSCULAR | Status: DC | PRN

## 2019-07-15 MED ORDER — KETOROLAC TROMETHAMINE 30 MG/ML IJ SOLN
15.0000 mg | Freq: Once | INTRAMUSCULAR | Status: AC
  Administered 2019-07-15: 15 mg via INTRAVENOUS

## 2019-07-15 MED ORDER — CEFAZOLIN SODIUM-DEXTROSE 2-4 GM/100ML-% IV SOLN
INTRAVENOUS | Status: AC
  Filled 2019-07-15: qty 100

## 2019-07-15 MED ORDER — FENTANYL CITRATE (PF) 100 MCG/2ML IJ SOLN
INTRAMUSCULAR | Status: DC | PRN
  Administered 2019-07-15: 25 ug via INTRAVENOUS
  Administered 2019-07-15: 50 ug via INTRAVENOUS
  Administered 2019-07-15: 25 ug via INTRAVENOUS
  Administered 2019-07-15: 50 ug via INTRAVENOUS

## 2019-07-15 MED ORDER — SODIUM CHLORIDE 0.9 % IV SOLN
INTRAVENOUS | Status: DC | PRN
  Administered 2019-07-15: 50 ug/min via INTRAVENOUS

## 2019-07-15 MED ORDER — DEXAMETHASONE SODIUM PHOSPHATE 10 MG/ML IJ SOLN
INTRAMUSCULAR | Status: DC | PRN
  Administered 2019-07-15: 10 mg via INTRAVENOUS

## 2019-07-15 MED ORDER — CELECOXIB 200 MG PO CAPS
400.0000 mg | ORAL_CAPSULE | Freq: Once | ORAL | Status: AC
  Administered 2019-07-15: 400 mg via ORAL

## 2019-07-15 MED ORDER — FENTANYL CITRATE (PF) 100 MCG/2ML IJ SOLN
25.0000 ug | INTRAMUSCULAR | Status: AC | PRN
  Administered 2019-07-15 (×6): 25 ug via INTRAVENOUS

## 2019-07-15 MED ORDER — GABAPENTIN 300 MG PO CAPS
ORAL_CAPSULE | ORAL | Status: AC
  Administered 2019-07-15: 12:00:00 300 mg via ORAL
  Filled 2019-07-15: qty 1

## 2019-07-15 MED ORDER — OXYCODONE-ACETAMINOPHEN 5-325 MG PO TABS: 1.0000 | ORAL_TABLET | ORAL | 0 refills | Status: AC | PRN

## 2019-07-15 MED ORDER — ACETAMINOPHEN 325 MG PO TABS
ORAL_TABLET | ORAL | Status: AC
  Administered 2019-07-15: 17:00:00 1000 mg via ORAL
  Filled 2019-07-15: qty 3

## 2019-07-15 MED ORDER — EPINEPHRINE 30 MG/30ML IJ SOLN
INTRAMUSCULAR | Status: DC | PRN
  Administered 2019-07-15: 4 mg

## 2019-07-15 SURGICAL SUPPLY — 66 items
ADAPTER IRRIG TUBE 2 SPIKE SOL (ADAPTER) ×3 IMPLANT
ANCHOR SUT CROSSFT 4.75 (Anchor) ×6 IMPLANT
ANCHOR YKNOT PRO RC HI-FI TAPE (Anchor) ×3 IMPLANT
BLADE FULL RADIUS 3.5 (BLADE) ×3 IMPLANT
BLADE INCISOR PLUS 4.5 (BLADE) ×3 IMPLANT
BLADE SURG MINI STRL (BLADE) ×3 IMPLANT
BRUSH SCRUB EZ  4% CHG (MISCELLANEOUS) ×2
BRUSH SCRUB EZ 4% CHG (MISCELLANEOUS) ×1 IMPLANT
BUR BR 5.5 WIDE MOUTH (BURR) ×3 IMPLANT
CANNULA SHOULDER 7CM (CANNULA) ×3 IMPLANT
CANNULA TWIST IN 8.25X7CM (CANNULA) ×3 IMPLANT
CHLORAPREP W/TINT 26 (MISCELLANEOUS) ×3 IMPLANT
COOLER POLAR GLACIER W/PUMP (MISCELLANEOUS) IMPLANT
COVER WAND RF STERILE (DRAPES) ×3 IMPLANT
CRADLE LAMINECT ARM (MISCELLANEOUS) ×3 IMPLANT
DEVICE SUCT BLK HOLE OR FLOOR (MISCELLANEOUS) ×3 IMPLANT
DRAPE 3/4 80X56 (DRAPES) ×3 IMPLANT
DRAPE SPLIT 6X30 W/TAPE (DRAPES) IMPLANT
DRAPE STERI 35X30 U-POUCH (DRAPES) ×3 IMPLANT
DRAPE U-SHAPE 47X51 STRL (DRAPES) ×6 IMPLANT
ELECT REM PT RETURN 9FT ADLT (ELECTROSURGICAL) ×3
ELECTRODE REM PT RTRN 9FT ADLT (ELECTROSURGICAL) ×1 IMPLANT
GAUZE 4X4 16PLY RFD (DISPOSABLE) IMPLANT
GAUZE SPONGE 4X4 12PLY STRL (GAUZE/BANDAGES/DRESSINGS) ×6 IMPLANT
GAUZE XEROFORM 1X8 LF (GAUZE/BANDAGES/DRESSINGS) ×3 IMPLANT
GLOVE BIOGEL PI IND STRL 8 (GLOVE) ×1 IMPLANT
GLOVE BIOGEL PI INDICATOR 8 (GLOVE) ×2
GLOVE SURG ORTHO 8.0 STRL STRW (GLOVE) ×3 IMPLANT
GOWN STRL REUS W/ TWL LRG LVL3 (GOWN DISPOSABLE) ×1 IMPLANT
GOWN STRL REUS W/ TWL XL LVL3 (GOWN DISPOSABLE) ×1 IMPLANT
GOWN STRL REUS W/TWL LRG LVL3 (GOWN DISPOSABLE) ×2
GOWN STRL REUS W/TWL XL LVL3 (GOWN DISPOSABLE) ×2
IV LACTATED RINGER IRRG 3000ML (IV SOLUTION) ×10
IV LR IRRIG 3000ML ARTHROMATIC (IV SOLUTION) ×5 IMPLANT
KIT STABILIZATION SHOULDER (MISCELLANEOUS) ×3 IMPLANT
KIT TURNOVER KIT A (KITS) ×3 IMPLANT
MANIFOLD NEPTUNE II (INSTRUMENTS) ×6 IMPLANT
MASK FACE SPIDER DISP (MASK) ×3 IMPLANT
MAT ABSORB  FLUID 56X50 GRAY (MISCELLANEOUS) ×2
MAT ABSORB FLUID 56X50 GRAY (MISCELLANEOUS) ×1 IMPLANT
NDL SAFETY ECLIPSE 18X1.5 (NEEDLE) ×1 IMPLANT
NEEDLE HYPO 18GX1.5 SHARP (NEEDLE) ×2
NEEDLE HYPO 22GX1.5 SAFETY (NEEDLE) ×3 IMPLANT
NEEDLE SCORPION MULTI FIRE (NEEDLE) ×3 IMPLANT
NEEDLE SPNL 18GX3.5 QUINCKE PK (NEEDLE) ×3 IMPLANT
PACK ARTHROSCOPY SHOULDER (MISCELLANEOUS) ×3 IMPLANT
PAD ABD DERMACEA PRESS 5X9 (GAUZE/BANDAGES/DRESSINGS) IMPLANT
PAD WRAPON POLAR SHDR XLG (MISCELLANEOUS) IMPLANT
SLING ARM LRG DEEP (SOFTGOODS) IMPLANT
SLING ULTRA II M (MISCELLANEOUS) ×3 IMPLANT
STRAP SAFETY 5IN WIDE (MISCELLANEOUS) ×3 IMPLANT
SUT ETHILON NAB PS2 4-0 18IN (SUTURE) ×3 IMPLANT
SUT FIBERWIRE #2 38 T-5 BLUE (SUTURE)
SUT PDS AB 0 CT1 27 (SUTURE) ×3 IMPLANT
SUT PROLENE 0 CT 2 (SUTURE) IMPLANT
SUT TIGER TAPE 7 IN WHITE (SUTURE) IMPLANT
SUTURE FIBERWR #2 38 T-5 BLUE (SUTURE) IMPLANT
SYR 10ML LL (SYRINGE) ×3 IMPLANT
SYR 50ML LL SCALE MARK (SYRINGE) ×3 IMPLANT
TAPE HI-FI 2MM BLUE (SUTURE) ×3 IMPLANT
TAPE MICROFOAM 4IN (TAPE) ×3 IMPLANT
TUBING ARTHRO INFLOW-ONLY STRL (TUBING) ×3 IMPLANT
TUBING CONNECTING 10 (TUBING) ×2 IMPLANT
TUBING CONNECTING 10' (TUBING) ×1
WAND WEREWOLF FLOW 90D (MISCELLANEOUS) ×3 IMPLANT
WRAPON POLAR PAD SHDR XLG (MISCELLANEOUS)

## 2019-07-15 NOTE — Anesthesia Procedure Notes (Signed)
Anesthesia Regional Block: Interscalene brachial plexus block   Pre-Anesthetic Checklist: ,, timeout performed, Correct Patient, Correct Site, Correct Laterality, Correct Procedure, Correct Position, site marked, Risks and benefits discussed,  Surgical consent,  Pre-op evaluation,  At surgeon's request and post-op pain management  Laterality: Right  Prep: chloraprep, alcohol swabs       Needles:  Injection technique: Single-shot  Needle Type: Stimiplex     Needle Length: 5cm  Needle Gauge: 22     Additional Needles:   Procedures:, nerve stimulator,,, ultrasound used (permanent image in chart),,,,   Nerve Stimulator or Paresthesia:  Response: biceps flexion, 0.6 mA,   Additional Responses:   Narrative:  Start time: 07/15/2019 12:30 PM End time: 07/15/2019 12:40 PM Injection made incrementally with aspirations every 5 mL.  Performed by: Personally  Anesthesiologist: Alvin Critchley, MD  Additional Notes: Time out called.  Patient placed in sitting position.  Roll placed behind the right back with head supported. A scout film was taken and a line was drawn lateral to the probe.  A skin wheal was made along this line with 1% Lidocaine plain.  The neck was then prepped with chloroprep.  A 22G stimuplex needle was guided to just lateral to the nerve bundle with a good biceps twitch down to 0.6 mAmps before start of fade.  Easy incremental injection of 30 cc mix of Exparel and 0.5% Marcaine plain.  Patient tolerated well and no pain on injection. Good apparent spread around the nerves

## 2019-07-15 NOTE — Discharge Instructions (Signed)
Wear sling at all times, including sleep.  You will need to use the sling for a total of 4 weeks following surgery.  Do not try and lift your arm up or away from your body for any reason.   Keep the dressing dry.  You may remove bandage in 3 days.  You may place Band-Aids over top of the incisions.  May shower once dressing is removed in 3 days.  Remove sling carefully only for showers, leaving arm down by your side while in the shower.  +++ Make sure to take some pain medication this evening before you fall asleep, in preparation for the nerve block wearing off in the middle of the night.  If the the pain medication causes itching, or is too strong, try taking a single tablet at a time, or combining with Benadryl.  You may be most comfortable sleeping in a recliner.  If you do sleep in near bed, placed pillows behind the shoulder that have the operation to support it.    AMBULATORY SURGERY  DISCHARGE INSTRUCTIONS   1) The drugs that you were given will stay in your system until tomorrow so for the next 24 hours you should not:  A) Drive an automobile B) Make any legal decisions C) Drink any alcoholic beverage   2) You may resume regular meals tomorrow.  Today it is better to start with liquids and gradually work up to solid foods.  You may eat anything you prefer, but it is better to start with liquids, then soup and crackers, and gradually work up to solid foods.   3) Please notify your doctor immediately if you have any unusual bleeding, trouble breathing, redness and pain at the surgery site, drainage, fever, or pain not relieved by medication. 4)   5) Your post-operative visit with Dr.                                     is: Date:                        Time:    Please call to schedule your post-operative visit.  6) Additional Instructions:    Interscalene Nerve Block with Exparel  1.  For your surgery you have received an Interscalene Nerve Block with  Exparel. 2. Nerve Blocks affect many types of nerves, including nerves that control movement, pain and normal sensation.  You may experience feelings such as numbness, tingling, heaviness, weakness or the inability to move your arm or the feeling or sensation that your arm has "fallen asleep". 3. A nerve block with Exparel can last up to 5 days.  Usually the weakness wears off first.  The tingling and heaviness usually wear off next.  Finally you may start to notice pain.  Keep in mind that this may occur in any order.  Once a nerve block starts to wear off it is usually completely gone within 60 minutes. 4. ISNB may cause mild shortness of breath, a hoarse voice, blurry vision, unequal pupils, or drooping of the face on the same side as the nerve block.  These symptoms will usually resolve with the numbness.  Very rarely the procedure itself can cause mild seizures. 5. If needed, your surgeon will give you a prescription for pain medication.  It will take about 60 minutes for the oral pain medication to become fully effective.  So, it is recommended that you start taking this medication before the nerve block first begins to wear off, or when you first begin to feel discomfort. 6. Take your pain medication only as prescribed.  Pain medication can cause sedation and decrease your breathing if you take more than you need for the level of pain that you have. 7. Nausea is a common side effect of many pain medications.  You may want to eat something before taking your pain medicine to prevent nausea. 8. After an Interscalene nerve block, you cannot feel pain, pressure or extremes in temperature in the effected arm.  Because your arm is numb it is at an increased risk for injury.  To decrease the possibility of injury, please practice the following:  a. While you are awake change the position of your arm frequently to prevent too much pressure on any one area for prolonged periods of time. b.  If you have a  cast or tight dressing, check the color or your fingers every couple of hours.  Call your surgeon with the appearance of any discoloration (white or blue). c. If you are given a sling to wear before you go home, please wear it  at all times until the block has completely worn off.  Do not get up at night without your sling. d. Please contact [facility] or your surgeon if you do not begin to regain sensation after 7 days from the surgery.  Anesthesia may be contacted by calling the Same Day Surgery Department, Mon. through Fri., 6 am to 4 pm at [phone].   e. If you experience any other problems or concerns, please contact your surgeon's office. f. If you experience severe or prolonged shortness of breath go to the nearest emergency department.

## 2019-07-15 NOTE — Anesthesia Post-op Follow-up Note (Signed)
Anesthesia QCDR form completed.        

## 2019-07-15 NOTE — H&P (Signed)
The patient has been re-examined, and the chart reviewed, and there have been no interval changes to the documented history and physical.  Plan a right shoulder arthroscopy today.  Anesthesia is consulted regarding a peripheral nerve block for post-operative pain.  The risks, benefits, and alternatives have been discussed at length, and the patient is willing to proceed.    

## 2019-07-15 NOTE — Transfer of Care (Signed)
Immediate Anesthesia Transfer of Care Note  Patient: Quynn Vilchis Rachels  Procedure(s) Performed: SHOULDER ARTHROSCOPY WITH ROTATOR CUFF REPAIR (Right Shoulder)  Patient Location: PACU  Anesthesia Type:General  Level of Consciousness: awake  Airway & Oxygen Therapy: Patient Spontanous Breathing and Patient connected to face mask oxygen  Post-op Assessment: Report given to RN and Post -op Vital signs reviewed and stable  Post vital signs: stable  Last Vitals:  Vitals Value Taken Time  BP 146/72 07/15/19 1536  Temp 36.3 C 07/15/19 1535  Pulse 84 07/15/19 1539  Resp 24 07/15/19 1539  SpO2 99 % 07/15/19 1539  Vitals shown include unvalidated device data.  Last Pain:  Vitals:   07/15/19 1113  TempSrc: Temporal  PainSc: 0-No pain         Complications: No apparent anesthesia complications

## 2019-07-15 NOTE — Anesthesia Procedure Notes (Signed)
Procedure Name: Intubation Date/Time: 07/15/2019 1:34 PM Performed by: Lavone Orn, CRNA Pre-anesthesia Checklist: Patient identified, Emergency Drugs available, Suction available and Patient being monitored Patient Re-evaluated:Patient Re-evaluated prior to induction Oxygen Delivery Method: Circle system utilized Preoxygenation: Pre-oxygenation with 100% oxygen Induction Type: IV induction and Cricoid Pressure applied Ventilation: Mask ventilation without difficulty Laryngoscope Size: Mac and 3 Grade View: Grade II Tube type: Oral Tube size: 7.0 mm Number of attempts: 1 Airway Equipment and Method: Stylet Placement Confirmation: ETT inserted through vocal cords under direct vision,  positive ETCO2 and breath sounds checked- equal and bilateral Secured at: 21 cm Tube secured with: Tape Dental Injury: Teeth and Oropharynx as per pre-operative assessment

## 2019-07-15 NOTE — Op Note (Addendum)
07/15/2019  3:16 PM  PATIENT:  Katherine Gates  85 y.o. female  PRE-OPERATIVE DIAGNOSIS:  M75.121 complete rotator cuff tear or rupture of right shoulder  POST-OPERATIVE DIAGNOSIS:  M75.121 complete rotator cuff tear or rupture of right shoulder  PROCEDURE:  Procedure(s): SHOULDER ARTHROSCOPY WITH ROTATOR CUFF REPAIR (Right)  SURGEON:  Surgeon(s) and Role:    Lovell Sheehan, MD - Primary  ASSIST: Carlynn Spry, PA-C  ANESTHESIA:   local, regional and general   PREOPERATIVE INDICATIONS:  DAIJANAE Gates is a  85 y.o. female with a diagnosis of M75.121 complete rotator cuff tear or rupture of right shoulder who failed conservative measures and elected for surgical management.    The risks benefits and alternatives were discussed with the patient preoperatively including but not limited to the risks of infection, bleeding, nerve injury, persistent pain or weakness, failure of the hardware, re-tear of the rotator cuff and the need for further surgery. Medical risks include DVT and pulmonary embolism, myocardial infarction, stroke, pneumonia, respiratory failure and death. Patient understood these risks and wished to proceed.  OPERATIVE IMPLANTS: [employer] SpeedBridge System  OPERATIVE PROCEDURE: The patient was met in the preoperative area. The right shoulder was signed with my initials according the hospital's correct site of surgery protocol. The patient is brought to the OR and underwent a supraclavicular block and general endotracheal intubation by the anesthesia service.  The patient was placed in a beachchair position. A spider arm positioner was used for this case. Examination under anesthesia revealed negative sulcus sign and full passive range of motion.  The patient was prepped and draped in a sterile fashion. A timeout was performed to verify the patient's name, date of birth, medical record number, correct site of surgery and correct procedure to be performed there was also used  to verify the patient received antibiotics that all appropriate instruments, implants and radiographs studies were available in the room. Once all in attendance were in agreement case began.  Bony landmarks were drawn out with a surgical marker along with proposed arthroscopy incisions. These were pre-injected with 1% lidocaine plain. An 11 blade was used to establish a posterior portal through which the arthroscope was placed in the glenohumeral joint. A full diagnostic examination of the shoulder was performed. The anterior portal was established under direct visualization with an 18-gauge spinal needle.  A 5.75 mm arthroscopic cannula was placed through the anterior portal.   The intra-articular portion of the biceps tendon was found to have a partial tear involving greater than 50% of the diameter. Therefore the decision was made to perform a tenotomy. An arthroscopic wand was used to release the biceps tendon off the superior labrum. The arthroscopic shaver was then used to debride the frayed edges of the labrum. There were no anterior or superior labral tears seen.  Subscapularis tendon was intact. Patient had a full-thickness tear involving the supraspinatus with retraction. There were no loose bodies within the inferior recess and no evidence of HAGL lesion.  The arthroscope was then placed in the subacromial space. A lateral portal was then established using an 18-gauge spinal needle for localization.   The greater tuberosity was debrided using a 5.5 mm resector shaver blade to remove all remaining foreign fibers of the rotator cuff.  Debridement was performed until punctate bleeding was seen at the greater tuberosity footprint, which will allow for rotator cuff healing.  Extensive bursitis was encountered and debrided using a 4-0 resector shaver blade and a 90 [employer]  wand from the lateral portal. Using the suture bridge system medial anchors with fiber tape were placed. The cuff was mobilized  and the tape passed through the rotator cuff. The tape was then crossed in usual fashion and fixated on the lateral side with two SwiveLock anchors. The final construct was stable and moved as a unit with excellent coverage of the humeral head.  Using the 5.5 mm burr a subacromial decompression was performed. Using the burr, approximately 8 mm of distal clavicle was then resected.  The portal sites were injected with local and Exparel for post-op pain relief.  All incisions were copiously irrigated. Skin closure for the arthroscopic incisions was performed with 4-0 nylon.  A dry sterile dressing including Steri-Strips was applied . The patient was placed in an abduction sling.  All sharp and instrument counts were correct at the conclusion of the case. I was scrubbed and present for the entire case. I spoke with the patient's family in the post-op consultation room and informed them that the case had been performed without complication and the patient was stable in recovery room.   Kurtis Bushman, MD

## 2019-07-15 NOTE — Anesthesia Postprocedure Evaluation (Signed)
Anesthesia Post Note  Patient: Katherine Gates  Procedure(s) Performed: SHOULDER ARTHROSCOPY WITH ROTATOR CUFF REPAIR (Right Shoulder)  Patient location during evaluation: PACU Anesthesia Type: General Level of consciousness: awake and alert and oriented Pain management: pain level controlled Vital Signs Assessment: post-procedure vital signs reviewed and stable Respiratory status: spontaneous breathing, nonlabored ventilation and respiratory function stable Cardiovascular status: blood pressure returned to baseline and stable Postop Assessment: no signs of nausea or vomiting Anesthetic complications: no     Last Vitals:  Vitals:   07/15/19 1545 07/15/19 1550  BP: (!) 142/55   Pulse: 79 78  Resp: 18 14  Temp:    SpO2: 100% 99%    Last Pain:  Vitals:   07/15/19 1550  TempSrc:   PainSc: 9                  Suzane Vanderweide

## 2019-07-15 NOTE — Anesthesia Preprocedure Evaluation (Addendum)
Anesthesia Evaluation  Patient identified by MRN, date of birth, ID band Patient awake    Reviewed: Allergy & Precautions, NPO status , Patient's Chart, lab work & pertinent test results  Airway Mallampati: III  TM Distance: <3 FB Neck ROM: Full    Dental  (+) Chipped, Caps   Pulmonary    Pulmonary exam normal breath sounds clear to auscultation       Cardiovascular negative cardio ROS Normal cardiovascular exam     Neuro/Psych Hx of Bell's palsy  Neuromuscular disease negative psych ROS   GI/Hepatic Neg liver ROS, GERD  Medicated and Controlled,IBS   Endo/Other  negative endocrine ROS  Renal/GU negative Renal ROS  negative genitourinary   Musculoskeletal  (+) Arthritis , Osteoarthritis,    Abdominal Normal abdominal exam  (+)   Peds negative pediatric ROS (+)  Hematology negative hematology ROS (+)   Anesthesia Other Findings Hx of melanoma excision of Left arm Fibrocystic breast disease Breast CA on right  Reproductive/Obstetrics                             Anesthesia Physical  Anesthesia Plan  ASA: II  Anesthesia Plan: General   Post-op Pain Management:  Regional for Post-op pain   Induction: Intravenous  PONV Risk Score and Plan:   Airway Management Planned: Oral ETT  Additional Equipment:   Intra-op Plan:   Post-operative Plan: Extubation in OR  Informed Consent: I have reviewed the patients History and Physical, chart, labs and discussed the procedure including the risks, benefits and alternatives for the proposed anesthesia with the patient or authorized representative who has indicated his/her understanding and acceptance.     Dental advisory given  Plan Discussed with: CRNA and Surgeon  Anesthesia Plan Comments: (Surgeon requests block for postop pain control.  The interscalene block was discussed with the patient in detail and risks were discussed which  included pneumothorax, nerve damage, infection, difficulty breathing, and block not working well among others.  Patient understands risks and wishes to proceed with the block.)       Anesthesia Quick Evaluation

## 2019-07-16 ENCOUNTER — Encounter: Payer: Self-pay | Admitting: Orthopedic Surgery

## 2019-07-24 ENCOUNTER — Emergency Department
Admission: EM | Admit: 2019-07-24 | Discharge: 2019-07-24 | Disposition: A | Discharge status: Home / Self Care | Payer: Medicare Other | Attending: Emergency Medicine | Admitting: Emergency Medicine

## 2019-07-24 ENCOUNTER — Other Ambulatory Visit: Payer: Self-pay

## 2019-07-24 ENCOUNTER — Encounter: Payer: Self-pay | Admitting: Emergency Medicine

## 2019-07-24 ENCOUNTER — Emergency Department: Payer: Medicare Other

## 2019-07-24 DIAGNOSIS — R1030 Lower abdominal pain, unspecified: Secondary | ICD-10-CM | POA: Diagnosis present

## 2019-07-24 DIAGNOSIS — Z7982 Long term (current) use of aspirin: Secondary | ICD-10-CM | POA: Diagnosis not present

## 2019-07-24 DIAGNOSIS — Z79899 Other long term (current) drug therapy: Secondary | ICD-10-CM | POA: Diagnosis not present

## 2019-07-24 DIAGNOSIS — Z853 Personal history of malignant neoplasm of breast: Secondary | ICD-10-CM | POA: Insufficient documentation

## 2019-07-24 DIAGNOSIS — K5792 Diverticulitis of intestine, part unspecified, without perforation or abscess without bleeding: Secondary | ICD-10-CM

## 2019-07-24 DIAGNOSIS — Z85828 Personal history of other malignant neoplasm of skin: Secondary | ICD-10-CM | POA: Insufficient documentation

## 2019-07-24 LAB — COMPREHENSIVE METABOLIC PANEL
ALT: 26 U/L (ref 0–44)
AST: 30 U/L (ref 15–41)
Albumin: 3.5 g/dL (ref 3.5–5.0)
Alkaline Phosphatase: 96 U/L (ref 38–126)
Anion gap: 10 (ref 5–15)
BUN: 18 mg/dL (ref 8–23)
CO2: 22 mmol/L (ref 22–32)
Calcium: 8.9 mg/dL (ref 8.9–10.3)
Chloride: 106 mmol/L (ref 98–111)
Creatinine, Ser: 0.9 mg/dL (ref 0.44–1.00)
GFR calc Af Amer: >60 mL/min (ref >60)
GFR calc non Af Amer: 59 mL/min — ABNORMAL LOW (ref >60)
Glucose, Bld: 128 mg/dL — ABNORMAL HIGH (ref 70–99)
Potassium: 4.1 mmol/L (ref 3.5–5.1)
Sodium: 138 mmol/L (ref 135–145)
Total Bilirubin: 0.7 mg/dL (ref 0.3–1.2)
Total Protein: 7 g/dL (ref 6.5–8.1)

## 2019-07-24 LAB — CBC WITH DIFFERENTIAL/PLATELET
Abs Immature Granulocytes: 0.08 10*3/uL — ABNORMAL HIGH (ref 0.00–0.07)
Basophils Absolute: 0 10*3/uL (ref 0.0–0.1)
Basophils Relative: 0 %
Eosinophils Absolute: 0.1 10*3/uL (ref 0.0–0.5)
Eosinophils Relative: 1 %
HCT: 38.2 % (ref 36.0–46.0)
Hemoglobin: 12.5 g/dL (ref 12.0–15.0)
Immature Granulocytes: 1 %
Lymphocytes Relative: 13 %
Lymphs Abs: 1.9 10*3/uL (ref 0.7–4.0)
MCH: 28.7 pg (ref 26.0–34.0)
MCHC: 32.7 g/dL (ref 30.0–36.0)
MCV: 87.8 fL (ref 80.0–100.0)
Monocytes Absolute: 1.2 10*3/uL — ABNORMAL HIGH (ref 0.1–1.0)
Monocytes Relative: 9 %
Neutro Abs: 10.6 10*3/uL — ABNORMAL HIGH (ref 1.7–7.7)
Neutrophils Relative %: 76 %
Platelets: 270 10*3/uL (ref 150–400)
RBC: 4.35 MIL/uL (ref 3.87–5.11)
RDW: 14.7 % (ref 11.5–15.5)
WBC: 13.9 10*3/uL — ABNORMAL HIGH (ref 4.0–10.5)
nRBC: 0 % (ref 0.0–0.2)

## 2019-07-24 LAB — URINALYSIS, COMPLETE (UACMP) WITH MICROSCOPIC
Bacteria, UA: NONE SEEN
Bilirubin Urine: NEGATIVE
Glucose, UA: NEGATIVE mg/dL
Hgb urine dipstick: NEGATIVE
Ketones, ur: NEGATIVE mg/dL
Leukocytes,Ua: NEGATIVE
Nitrite: NEGATIVE
Protein, ur: NEGATIVE mg/dL
Specific Gravity, Urine: 1.014 (ref 1.005–1.030)
pH: 6 (ref 5.0–8.0)

## 2019-07-24 LAB — LIPASE, BLOOD: Lipase: 22 U/L (ref 11–51)

## 2019-07-24 LAB — PROCALCITONIN: Procalcitonin: <0.1 ng/mL

## 2019-07-24 LAB — LACTIC ACID, PLASMA: Lactic Acid, Venous: 1.1 mmol/L (ref 0.5–1.9)

## 2019-07-24 MED ORDER — ONDANSETRON 4 MG PO TBDP: ORAL_TABLET | ORAL | 0 refills | Status: DC

## 2019-07-24 MED ORDER — METRONIDAZOLE 500 MG PO TABS: 500.0000 mg | ORAL_TABLET | Freq: Three times a day (TID) | ORAL | 0 refills | Status: AC

## 2019-07-24 MED ORDER — CIPROFLOXACIN HCL 500 MG PO TABS: 500.0000 mg | ORAL_TABLET | Freq: Two times a day (BID) | ORAL | 0 refills | Status: AC

## 2019-07-24 MED ORDER — IOHEXOL 300 MG/ML  SOLN
100.0000 mL | Freq: Once | INTRAMUSCULAR | Status: AC | PRN
  Administered 2019-07-24: 100 mL via INTRAVENOUS

## 2019-07-24 NOTE — ED Notes (Signed)
Pt transported to CT ?

## 2019-07-24 NOTE — ED Triage Notes (Signed)
Pt reports she had shoulder surgery x1 week ago and for the last 2 days pt has had lower abdominal pain as well as painful urination. Pt is on cipro post surgery.

## 2019-07-24 NOTE — ED Provider Notes (Signed)
[facility] Emergency Department Provider Note  ____________________________________________   First MD Initiated Contact with Patient 07/24/19 0413     (approximate)  I have reviewed the triage vital signs and the nursing notes.   HISTORY  Chief Complaint Abdominal Pain    HPI Katherine Gates is a 85 y.o. female with medical history as listed below which notably includes a recent shoulder surgery (about 8 or 9 days ago by Dr. Harlow Mares) who presents tonight by private vehicle for gradually worsening lower abdominal pain for the last 2 days.  She has not had any injury.  She said that initially after the surgery she was briefly constipated but now she is going regularly and had 3 normal bowel movements today.  She has had no nausea nor vomiting.  She reports that the pain in her lower abdomen is severe when she moves around but is "okay" at rest at the moment.  She denies any contact with COVID-19 patients.  She denies fever/chills, chest pain, shortness of breath, cough, sore throat, and loss of smell or taste.  She has been having some dysuria and has been on Cipro for a couple of days prescribed by her primary care doctor, Dr. Doy Hutching, but it does not seem to be helping.         Past Medical History:  Diagnosis Date  . Bell's palsy   . Breast cancer ([place]) 2017   right  . Dermatitis contact, eyelid   . Environmental allergies   . Fibrocystic breast disease    being followed at [facility]   . GERD (gastroesophageal reflux disease)   . HOH (hard of hearing)   . IBS (irritable bowel syndrome)   . Melanoma of skin ([place]) 2011   left upper arm  . Neuropathy    JUST IN LEGS  . Osteoarthritis   . Personal history of radiation therapy   . Rosacea     Patient Active Problem List   Diagnosis Date Noted  . Spondylolisthesis at L3-L4 level 01/12/2019  . Hyperlipidemia, mixed 09/04/2016  . Cancer of central portion of right female breast ([place]) 03/14/2016  .  Breast cancer ([place]) 02/08/2016  . Environmental allergies 01/11/2016  . Fibrocystic breast disease 01/11/2016  . IBS (irritable bowel syndrome) 01/11/2016  . Recurrent UTI (urinary tract infection) 01/11/2016  . Recurrent vaginitis 01/11/2016  . Dermatitis contact, eyelid 12/16/2013  . Bell's palsy 11/10/2013  . H/O hysterectomy for benign disease 11/10/2013  . Melanoma of skin ([place]) 11/10/2013  . Osteoarthritis 11/10/2013  . Rosacea 11/10/2013    Past Surgical History:  Procedure Laterality Date  . ABDOMINAL HYSTERECTOMY    . BACK SURGERY  01/12/2019   L3,4,5 with titanium screws  . BREAST BIOPSY Right 02/29/2016   +  . BREAST EXCISIONAL BIOPSY Right 03/2016   + rad  . ENDOSCOPIC CARPAL TUNNEL RELEASE Bilateral   . EYE SURGERY     bilateral cataract  . eyelid lift    . LUMBAR FUSION  01/12/2019  . PARTIAL MASTECTOMY WITH NEEDLE LOCALIZATION Right 03/16/2016   Procedure: PARTIAL MASTECTOMY;  Surgeon: Leonie Green, MD;  Location: [facility] ORS;  Service: General;  Laterality: Right;  . repair tear ducts    . SENTINEL NODE BIOPSY Right 03/16/2016   Procedure: SENTINEL LYMPH NODE BIOPSY;  Surgeon: Leonie Green, MD;  Location: [facility] ORS;  Service: General;  Laterality: Right;  . SHOULDER ARTHROSCOPY WITH ROTATOR CUFF REPAIR Right 07/15/2019   Procedure: SHOULDER ARTHROSCOPY WITH ROTATOR  CUFF REPAIR;  Surgeon: Lovell Sheehan, MD;  Location: [facility] ORS;  Service: Orthopedics;  Laterality: Right;    Prior to Admission medications   Medication Sig Start Date End Date Taking? Authorizing Provider  aspirin EC 81 MG tablet Take 81 mg by mouth daily.     [provider]  ciprofloxacin (CIPRO) 500 MG tablet Take 1 tablet (500 mg total) by mouth 2 (two) times daily for 7 days. 07/24/19 07/31/19  Hinda Kehr, MD  cyanocobalamin 1000 MCG tablet Take 1,000 mcg by mouth daily.     [provider]  docusate sodium (COLACE) 100 MG capsule Take 1 capsule (100 mg total) by  mouth daily as needed. 07/15/19 07/14/20  Lovell Sheehan, MD  fexofenadine (ALLEGRA) 180 MG tablet Take 180 mg by mouth daily.     [provider]  fluticasone (FLONASE) 50 MCG/ACT nasal spray Place 2 sprays into both nostrils daily.     [provider]  metroNIDAZOLE (FLAGYL) 500 MG tablet Take 1 tablet (500 mg total) by mouth 3 (three) times daily for 14 days. 07/24/19 08/07/19  Hinda Kehr, MD  metroNIDAZOLE (METROGEL) 0.75 % gel Apply 1 application topically 2 (two) times daily.  12/15/15   [provider]  ondansetron (ZOFRAN ODT) 4 MG disintegrating tablet Allow 1-2 tablets to dissolve in your mouth every 8 hours as needed for nausea/vomiting 07/24/19   Hinda Kehr, MD  oxyCODONE-acetaminophen (PERCOCET) 5-325 MG tablet Take 1 tablet by mouth every 4 (four) hours as needed for severe pain. 07/15/19 07/14/20  Lovell Sheehan, MD  pantoprazole (PROTONIX) 40 MG tablet Take 40 mg by mouth 2 (two) times daily.  02/12/18 07/15/19  [provider]  Polyethyl Glycol-Propyl Glycol (SYSTANE ULTRA PF OP) Place 1 drop into both eyes 2 (two) times a day.    [provider]    Allergies Benzalkonium chloride, Codeine, Garlic, and Methylisothiazolinone  Family History  Problem Relation Age of Onset  . Skin cancer Brother        non-melanoma  . Skin cancer Father        non-melanoma  . Throat cancer Father   . Stroke Mother   . Breast cancer Neg Hx     Social History Social History   Tobacco Use  . Smoking status: Never Smoker  . Smokeless tobacco: Never Used  Substance Use Topics  . Alcohol use: No    Alcohol/week: 0.0 standard drinks  . Drug use: No    Review of Systems Constitutional: No fever/chills Eyes: No visual changes. ENT: No sore throat. Cardiovascular: Denies chest pain. Respiratory: Denies shortness of breath. Gastrointestinal: Lower abdominal pain for about 2 days.  No constipation, vomiting, or nausea. Genitourinary:  +dysuria. Musculoskeletal: Negative for neck pain.  Negative for back pain. Integumentary: Negative for rash. Neurological: Negative for headaches, focal weakness or numbness.   ____________________________________________   PHYSICAL EXAM:  VITAL SIGNS: ED Triage Vitals  Enc Vitals Group     BP 07/24/19 0407 (!) 170/106     Pulse Rate 07/24/19 0407 (!) 108     Resp 07/24/19 0407 16     Temp 07/24/19 0407 99.1 F (37.3 C)     Temp Source 07/24/19 0407 Oral     SpO2 07/24/19 0407 97 %     Weight 07/24/19 0409 71.7 kg (158 lb)     Height 07/24/19 0409 1.575 m (5\' 2" )     Head Circumference --      Peak Flow --  Pain Score --      Pain Loc --      Pain Edu? --      Excl. in [place]? --     Constitutional: Alert and oriented.  Generally well-appearing for her age, appropriately interactive and making jokes with me. Eyes: Conjunctivae are normal.  Head: Atraumatic. Nose: No congestion/rhinnorhea. Mouth/Throat: Mucous membranes are moist. Neck: No stridor.  No meningeal signs.   Cardiovascular: Normal rate, regular rhythm. Good peripheral circulation. Grossly normal heart sounds. Respiratory: Normal respiratory effort.  No retractions. Gastrointestinal: Soft nondistended.  Severe tenderness to palpation all throughout the lower abdomen with some guarding.  Difficult to appreciate rebound due to the guarding.  Left and right sides seem equally tender. Musculoskeletal: No lower extremity tenderness nor edema. No gross deformities of extremities. Neurologic:  Normal speech and language. No gross focal neurologic deficits are appreciated.  Skin:  Skin is warm, dry and intact. Psychiatric: Mood and affect are normal. Speech and behavior are normal.  ____________________________________________   LABS (all labs ordered are listed, but only abnormal results are displayed)  Labs Reviewed  CBC WITH DIFFERENTIAL/PLATELET - Abnormal; Notable for the following components:      Result  Value   WBC 13.9 (*)    Neutro Abs 10.6 (*)    Monocytes Absolute 1.2 (*)    Abs Immature Granulocytes 0.08 (*)    All other components within normal limits  COMPREHENSIVE METABOLIC PANEL - Abnormal; Notable for the following components:   Glucose, Bld 128 (*)    GFR calc non Af Amer 59 (*)    All other components within normal limits  URINALYSIS, COMPLETE (UACMP) WITH MICROSCOPIC - Abnormal; Notable for the following components:   Color, Urine YELLOW (*)    APPearance CLEAR (*)    All other components within normal limits  URINE CULTURE  LACTIC ACID, PLASMA  LIPASE, BLOOD  PROCALCITONIN   ____________________________________________  EKG  None - EKG not ordered by ED physician ____________________________________________  RADIOLOGY Ursula Alert, personally viewed and evaluated these images (plain radiographs) as part of my medical decision making, as well as reviewing the written report by the radiologist.  ED MD interpretation: Acute uncomplicated diverticulitis  Official radiology report(s): Ct Abdomen Pelvis W Contrast  Result Date: 07/24/2019 CLINICAL DATA:  Lower abdominal pain EXAM: CT ABDOMEN AND PELVIS WITH CONTRAST TECHNIQUE: Multidetector CT imaging of the abdomen and pelvis was performed using the standard protocol following bolus administration of intravenous contrast. CONTRAST:  124mL OMNIPAQUE IOHEXOL 300 MG/ML  SOLN COMPARISON:  None. FINDINGS: Lower chest:  No contributory findings. Hepatobiliary: Hepatic steatosis.No evidence of biliary obstruction or stone. Pancreas: Unremarkable. Spleen: Unremarkable. Adrenals/Urinary Tract: Negative adrenals. No hydronephrosis or stone. Unremarkable bladder. Stomach/Bowel: Focal colonic wall thickening with submucosal low-density appearance around a thickened diverticulum with pericolonic fat inflammation. No pneumoperitoneum or abscess. Diverticulosis is mainly limited to the sigmoid. Negative for bowel obstruction.  Vascular/Lymphatic: No acute vascular abnormality. Scattered atherosclerotic calcifications. No mass or adenopathy. Reproductive:Hysterectomy. Thickened left ovary considered secondary to the adjacent sigmoid inflammation. Other: No ascites or pneumoperitoneum. Musculoskeletal: No acute abnormalities.  L3-4 and L4-5 PLIF. IMPRESSION: 1. Uncomplicated sigmoid diverticulitis. 2. Hepatic steatosis and atherosclerosis. Electronically Signed   By: Monte Fantasia M.D.   On: 07/24/2019 05:59    ____________________________________________   PROCEDURES   Procedure(s) performed (including Critical Care):  Procedures   ____________________________________________   INITIAL IMPRESSION / MDM / [place] / ED COURSE  As part of my medical decision  making, I reviewed the following data within the [place] notes reviewed and incorporated, Labs reviewed , Old chart reviewed, Notes from prior ED visits and [place] Controlled Substance Database   Differential diagnosis includes, but is not limited to, diverticulitis, appendicitis, biliary colic, SBO/ileus, constipation from narcotics after her shoulder surgery, UTI/pyelonephritis.  The patient is generally well-appearing but was uncomfortable when she came in although she did ambulate to her room herself.  Very mild tachycardia but that could be secondary to her discomfort and ambulation.  Broad laboratory evaluation is pending and I will plan on a CT scan of the abdomen and pelvis given the patient's age and nature of her complaint.  Urinalysis will be pending as well although it is notable that the patient has been on Cipro for a couple of days.  I offered analgesia and antiemetics but the patient declines at this time.  No concern for COVID-19 nor ACS currently.      Clinical Course as of Jul 23 620  Fri Jul 24, 2019  0502 Normal CMP, proceeding with CT scan of the abd/pelvis with IV contrast.  Comprehensive metabolic  panel(!) [CF]  7341 Mild leukocytosis  CBC with Differential/Platelet(!) [CF]  0503 Lactic Acid, Venous: 1.1 [CF]  0504 Lipase: 22 [CF]  0601 Reassuring and unremarkable urinalysis  Urinalysis, Complete w Microscopic(!) [CF]  0601 Procalcitonin: <0.10 [CF]  9379 The patient has uncomplicated diverticulitis.  I updated her with the results.  Since she is already on Cipro (500 mg p.o. twice daily x7 days) for a presumed urinary tract infection, I will continue treatment with the Cipro by giving her another week worth of medicine, along with a Rx for metronidazole 500 mg PO TID x 14 days.  Zofran for nausea, and the patient declines any other pain medicine because she says she has plenty at home.  I stressed the need for low fiber foods.  She will follow-up with her primary care doctor for a follow-up appointment and I gave my usual customary return precautions.  Of note, I offered to give her a first dose of metronidazole in the ED before she went home but she declines in favor of going to the pharmacy in the morning.  CT ABDOMEN PELVIS W CONTRAST [CF]    Clinical Course User Index [CF] Hinda Kehr, MD     ____________________________________________  FINAL CLINICAL IMPRESSION(S) / ED DIAGNOSES  Final diagnoses:  Acute diverticulitis     MEDICATIONS GIVEN DURING THIS VISIT:  Medications  iohexol (OMNIPAQUE) 300 MG/ML solution 100 mL (100 mLs Intravenous Contrast Given 07/24/19 0539)     ED Discharge Orders         Ordered    ciprofloxacin (CIPRO) 500 MG tablet  2 times daily     07/24/19 0617    metroNIDAZOLE (FLAGYL) 500 MG tablet  3 times daily     07/24/19 0617    ondansetron (ZOFRAN ODT) 4 MG disintegrating tablet     07/24/19 0617          *Please note:  Narcissa Melder Proud was evaluated in Emergency Department on 07/24/2019 for the symptoms described in the history of present illness. She was evaluated in the context of the global COVID-19 pandemic, which necessitated  consideration that the patient might be at risk for infection with the SARS-CoV-2 virus that causes COVID-19. Institutional protocols and algorithms that pertain to the evaluation of patients at risk for COVID-19 are in a state of rapid change based on  information released by regulatory bodies including the CDC and federal and state organizations. These policies and algorithms were followed during the patient's care in the ED.  Some ED evaluations and interventions may be delayed as a result of limited staffing during the pandemic.*  Note:  This document was prepared using Dragon voice recognition software and may include unintentional dictation errors.   Hinda Kehr, MD 07/24/19 [phone]

## 2019-07-24 NOTE — Discharge Instructions (Signed)
We believe your symptoms are caused by diverticulitis.  Most of the time this condition (please read through the included information) can be cured with outpatient antibiotics.  Please take the full course of prescribed medication(s) and follow up with the doctors recommended above.  Start taking the Flagyl (metronidazole) this morning and take the full course of treatment as prescribed.  Please continue taking the Cipro prescription you were previously given by your primary care doctor.  Once it is done, immediately start taking the second prescription for Cipro that I provided you.  This will give you a full course of treatment of 14 days of Cipro.  Return to the ED if your abdominal pain worsens or fails to improve, you develop bloody vomiting, bloody diarrhea, you are unable to tolerate fluids due to vomiting, fever greater than 101, or other symptoms that concern you.

## 2019-07-25 LAB — URINE CULTURE
Culture: NO GROWTH
Special Requests: NORMAL

## 2019-08-28 ENCOUNTER — Ambulatory Visit: Payer: Medicare Other | Admitting: Podiatry

## 2019-09-07 ENCOUNTER — Encounter: Payer: Self-pay | Admitting: Podiatry

## 2019-09-07 ENCOUNTER — Ambulatory Visit (INDEPENDENT_AMBULATORY_CARE_PROVIDER_SITE_OTHER): Payer: Medicare Other | Admitting: Podiatry

## 2019-09-07 ENCOUNTER — Other Ambulatory Visit: Payer: Self-pay

## 2019-09-07 ENCOUNTER — Ambulatory Visit (INDEPENDENT_AMBULATORY_CARE_PROVIDER_SITE_OTHER): Payer: Medicare Other

## 2019-09-07 DIAGNOSIS — M722 Plantar fascial fibromatosis: Secondary | ICD-10-CM | POA: Diagnosis not present

## 2019-09-07 MED ORDER — MELOXICAM 15 MG PO TABS: 15.0000 mg | ORAL_TABLET | Freq: Every day | ORAL | 3 refills | Status: DC

## 2019-09-07 NOTE — Progress Notes (Signed)
She presents today chief complaint of heel and arch pain left times the past 3 to 4 months states that recently she has had the shingles states she has been in a lot of pain anyway has not been taking any anti-inflammatories she states that it feels like she is walking on a bruise she has been using the plantar fascial brace for the right foot on the left foot and soaking in Epsom salts and warm water.  ROS: Denies fever chills nausea vomiting muscle aches pains calf pain back pain chest pain shortness of breath.  Objective: Vital signs are stable alert and oriented x3.  Pulses are palpable.  Neurologic sensorium is intact.  dT reflexes are intact.  Muscle strength is normal symmetrical left.  She has pain on palpation medial calcaneal tubercle of the left heel along the medial band of the plantar fascial insertion site.  Radiographs taken today demonstrate soft tissue increase in density at the plantar fashion calcaneal insertion site no acute findings are noted.  Assessment: Plantar fasciitis left.  Plan: Discussed etiology pathology conservative versus surgical therapies at this point time I injected the left heel today with 20 mg Kenalog 5 mg Marcaine point of maximal tenderness.  I also started her back on her meloxicam 15 mg 1 p.o. daily.  Follow-up with her in 1 month if necessary.

## 2019-09-07 NOTE — Addendum Note (Signed)
Addended by: Graceann Congress D on: 09/07/2019 04:59 PM   Modules accepted: Orders

## 2019-10-05 ENCOUNTER — Ambulatory Visit: Payer: Medicare Other | Admitting: Podiatry

## 2019-10-23 ENCOUNTER — Other Ambulatory Visit: Payer: Self-pay | Admitting: Internal Medicine

## 2019-10-23 DIAGNOSIS — R930 Abnormal findings on diagnostic imaging of skull and head, not elsewhere classified: Secondary | ICD-10-CM

## 2019-11-06 ENCOUNTER — Other Ambulatory Visit: Payer: Self-pay

## 2019-11-06 ENCOUNTER — Ambulatory Visit
Admission: RE | Admit: 2019-11-06 | Discharge: 2019-11-06 | Disposition: A | Discharge status: Home / Self Care | Payer: Medicare Other | Source: Ambulatory Visit | Attending: Internal Medicine | Admitting: Internal Medicine

## 2019-11-06 DIAGNOSIS — R930 Abnormal findings on diagnostic imaging of skull and head, not elsewhere classified: Secondary | ICD-10-CM | POA: Insufficient documentation

## 2019-11-06 LAB — POCT I-STAT CREATININE: Creatinine, Ser: 0.9 mg/dL (ref 0.44–1.00)

## 2019-11-06 MED ORDER — GADOBUTROL 1 MMOL/ML IV SOLN
7.0000 mL | Freq: Once | INTRAVENOUS | Status: AC | PRN
  Administered 2019-11-06: 11:00:00 7 mL via INTRAVENOUS

## 2019-12-31 ENCOUNTER — Ambulatory Visit
Admission: RE | Admit: 2019-12-31 | Discharge: 2019-12-31 | Disposition: A | Discharge status: Home / Self Care | Payer: Medicare Other | Source: Ambulatory Visit | Attending: Oncology | Admitting: Oncology

## 2019-12-31 DIAGNOSIS — C50111 Malignant neoplasm of central portion of right female breast: Secondary | ICD-10-CM | POA: Insufficient documentation

## 2019-12-31 DIAGNOSIS — Z170 Estrogen receptor positive status [ER+]: Secondary | ICD-10-CM | POA: Diagnosis present

## 2020-01-08 NOTE — Progress Notes (Signed)
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  INTERVAL HISTORY: Patient returns to clinic today for routine 39-monthevaluation and discussion of her mammogram results.  She continues to have chronic right shoulder pain, but otherwise feels well.  She has no neurologic complaints.  She denies any recent fevers or illnesses.  She has a good appetite and denies weight loss.  She denies any chest pain, shortness of breath, cough, or hemoptysis.  She denies any nausea, vomiting, constipation, or diarrhea. She has no urinary complaints.  Patient offers no further specific complaints today.  REVIEW OF SYSTEMS:   Review of Systems  Constitutional: Negative.  Negative for fever, malaise/fatigue and weight loss.  Respiratory: Negative.  Negative for cough, hemoptysis and shortness of breath.   Cardiovascular: Negative.  Negative for chest pain and leg swelling.  Gastrointestinal: Negative.  Negative for abdominal pain.  Genitourinary: Negative.  Negative for dysuria.  Musculoskeletal: Positive for joint pain. Negative for back pain.  Skin: Negative.  Negative for rash.  Neurological: Negative.  Negative for dizziness, sensory change, focal weakness, weakness and headaches.  Psychiatric/Behavioral: Negative.  The patient is not nervous/anxious and does not have insomnia.     As per HPI. Otherwise, a complete review of systems is negative.  PAST MEDICAL HISTORY: Past Medical History:  Diagnosis Date  . Bell's palsy   . Breast cancer ([place]   right  . Dermatitis contact, eyelid   . Environmental allergies   . Fibrocystic breast disease    [facility]  . GERD (gastroesophageal  reflux disease)   . HOH (hard of hearing)   . IBS (irritable bowel syndrome)   . [place] ([place]   left upper arm  . Neuropathy    JUST IN LEGS  . Osteoarthritis   . Personal history of radiation therapy 2017   right breast ca  . Rosacea     PAST SURGICAL HISTORY: Past Surgical History:  Procedure Laterality Date  . ABDOMINAL HYSTERECTOMY    . BACK SURGERY  01/12/2019   L3,4,5 with titanium screws  . BREAST BIOPSY Right 02/29/2016   mucinous ca  . BREAST LUMPECTOMY Right 03/16/2016   mucinous ca clear margins and negative LN  . ENDOSCOPIC CARPAL TUNNEL RELEASE Bilateral   . EYE SURGERY     bilateral cataract  . eyelid lift    . LUMBAR FUSION  01/12/2019  . PARTIAL MASTECTOMY WITH NEEDLE LOCALIZATION Right 03/16/2016   Procedure: PARTIAL MASTECTOMY;  Surgeon: JLeonie Green MD;  Location: [facility] ORS;  Service: General;  Laterality: Right;  . repair tear ducts    . SENTINEL NODE BIOPSY Right 03/16/2016   Procedure: SENTINEL LYMPH NODE BIOPSY;  Surgeon: JLeonie Green MD;  Location: [facility] ORS;  Service: General;  Laterality: Right;  . SHOULDER ARTHROSCOPY WITH ROTATOR CUFF REPAIR Right 07/15/2019   Procedure: SHOULDER ARTHROSCOPY WITH ROTATOR CUFF REPAIR;  Surgeon: BLovell Sheehan MD;  Location: [facility] ORS;  Service: Orthopedics;  Laterality: Right;    FAMILY HISTORY Family History  Problem Relation Age of Onset  . Skin cancer Brother        non-melanoma  . Skin cancer Father        non-melanoma  .  Throat cancer Father   . Stroke Mother   . Breast cancer Neg Hx        ADVANCED DIRECTIVES:    HEALTH MAINTENANCE: Social History   Tobacco Use  . Smoking status: Never Smoker  . Smokeless tobacco: Never Used  Substance Use Topics  . Alcohol use: No    Alcohol/week: 0.0 standard drinks  . Drug use: No     Colonoscopy:  PAP:  Bone density:  Lipid panel:  Allergies  Allergen Reactions  . Benzalkonium Chloride Other (See Comments)    Positive  patch test  . Codeine Nausea And Vomiting  . Garlic Diarrhea  . Methylisothiazolinone Other (See Comments)    Positive patch test     Current Outpatient Medications  Medication Sig Dispense Refill  . aspirin EC 81 MG tablet Take 81 mg by mouth daily.     . cyanocobalamin 1000 MCG tablet Take 1,000 mcg by mouth daily.     Marland Kitchen docusate sodium (COLACE) 100 MG capsule Take 1 capsule (100 mg total) by mouth daily as needed. 30 capsule 2  . fexofenadine (ALLEGRA) 180 MG tablet Take 180 mg by mouth daily.     . fluticasone (FLONASE) 50 MCG/ACT nasal spray Place 2 sprays into both nostrils daily.     . meloxicam (MOBIC) 15 MG tablet Take 1 tablet (15 mg total) by mouth daily. 30 tablet 3  . metroNIDAZOLE (FLAGYL) 500 MG tablet metronidazole 500 mg tablet    . ondansetron (ZOFRAN ODT) 4 MG disintegrating tablet Allow 1-2 tablets to dissolve in your mouth every 8 hours as needed for nausea/vomiting 30 tablet 0  . oxyCODONE-acetaminophen (PERCOCET) 5-325 MG tablet Take 1 tablet by mouth every 4 (four) hours as needed for severe pain. 20 tablet 0  . Polyethyl Glycol-Propyl Glycol (SYSTANE ULTRA PF OP) Place 1 drop into both eyes 2 (two) times a day.    . pantoprazole (PROTONIX) 40 MG tablet Take 40 mg by mouth 2 (two) times daily.      No current facility-administered medications for this visit.    OBJECTIVE: Vitals:   01/11/20 1050  BP: (!) 183/68  Pulse: 97  Temp: (!) 97.1 F (36.2 C)     Body mass index is 28.64 kg/m.    ECOG FS:0 - Asymptomatic  General: Well-developed, well-nourished, no acute distress. Eyes: Pink conjunctiva, anicteric sclera. HEENT: Normocephalic, moist mucous membranes. Breast: Exam deferred today. Lungs: No audible wheezing or coughing. Heart: Regular rate and rhythm. Abdomen: Soft, nontender, no obvious distention. Musculoskeletal: No edema, cyanosis, or clubbing. Neuro: Alert, answering all questions appropriately. Cranial nerves grossly intact. Skin: No  rashes or petechiae noted. Psych: Normal affect.   LAB RESULTS:  Lab Results  Component Value Date   NA 138 07/24/2019   K 4.1 07/24/2019   CL 106 07/24/2019   CO2 22 07/24/2019   GLUCOSE 128 (H) 07/24/2019   BUN 18 07/24/2019   CREATININE 0.90 11/06/2019   CALCIUM 8.9 07/24/2019   PROT 7.0 07/24/2019   ALBUMIN 3.5 07/24/2019   AST 30 07/24/2019   ALT 26 07/24/2019   ALKPHOS 96 07/24/2019   BILITOT 0.7 07/24/2019   GFRNONAA 59 (L) 07/24/2019   GFRAA >60 07/24/2019    Lab Results  Component Value Date   WBC 13.9 (H) 07/24/2019   NEUTROABS 10.6 (H) 07/24/2019   HGB 12.5 07/24/2019   HCT 38.2 07/24/2019   MCV 87.8 07/24/2019   PLT 270 07/24/2019      STUDIES: MM DIAG  BREAST TOMO BILATERAL  Result Date: 12/31/2019 CLINICAL DATA:  Routine annual mammogram in a patient with history of right breast mucinous carcinoma, diagnosed in 2017. She is asymptomatic. EXAM: DIGITAL DIAGNOSTIC BILATERAL MAMMOGRAM WITH CAD AND TOMO COMPARISON:  Previous exam(s). ACR Breast Density Category b: There are scattered areas of fibroglandular density. FINDINGS: Postsurgical changes of the anterior central right breast. No mass, nonsurgical distortion, or suspicious microcalcification is identified in either breast to suggest malignancy. Mammographic images were processed with CAD. IMPRESSION: No evidence of malignancy in either breast. RECOMMENDATION: Diagnostic mammogram is suggested in 1 year. (Code:[id number]-B-01Y) I have discussed the findings and recommendations with the patient. If applicable, a reminder letter will be sent to the patient regarding the next appointment. BI-RADS CATEGORY  2: Benign. Electronically Signed   By: Curlene Dolphin M.D.   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 Mammoprint low risk.  PLAN:    1. Pathologic stage IA ER/PR positive, HER-2 negative adenocarcinoma the central  portion of right breast: Final pathology results as above with a low risk Mammoprint therefore chemotherapy was not necessary. Patient completed adjuvant XRT in August 2017.  Patient discontinued both letrozole and Aromasin secondary to a significant side effects and only completed approximately 2 years of aromatase inhibitor treatment.  Patient expressed understanding that her risk of recurrence may increase by not completing 5 years of treatment.  No further intervention is needed at this time.  Her most recent mammogram on December 31, 2019 was reported as BI-RADS 2.  Repeat in January 2022.  Return to clinic in 1 year for routine evaluation.    2.  Osteopenia: Bone marrow density completed on August 04, 2018 reported T score of -1.3.  This is slightly worse than 2 years prior when her T score was normal at -0.5.  Have recommended patient take calcium and vitamin D supplementation.  Continue monitoring per primary care. 3.  Shoulder pain: Continue follow-up with orthopedics as indicated. 4.  Hypertension: Patient's blood pressure is significantly elevated today.  Continue monitoring and treatment per primary care.   Patient expressed understanding and was in agreement with this plan. She also understands that She can call clinic at any time with any questions, concerns, or complaints.    Lloyd Huger, MD   01/11/2020 1:22 PM

## 2020-01-08 NOTE — Progress Notes (Signed)
Patient is coming in for follow up she is doing well no complaints  

## 2020-01-11 ENCOUNTER — Other Ambulatory Visit: Payer: Self-pay

## 2020-01-11 ENCOUNTER — Inpatient Hospital Stay: Payer: Medicare Other | Attending: Oncology | Admitting: Oncology

## 2020-01-11 VITALS — BP 183/68 | HR 97 | Temp 97.1°F | Wt 156.6 lb

## 2020-01-11 DIAGNOSIS — Z7982 Long term (current) use of aspirin: Secondary | ICD-10-CM | POA: Insufficient documentation

## 2020-01-11 DIAGNOSIS — K219 Gastro-esophageal reflux disease without esophagitis: Secondary | ICD-10-CM | POA: Insufficient documentation

## 2020-01-11 DIAGNOSIS — Z791 Long term (current) use of non-steroidal anti-inflammatories (NSAID): Secondary | ICD-10-CM | POA: Diagnosis not present

## 2020-01-11 DIAGNOSIS — M25511 Pain in right shoulder: Secondary | ICD-10-CM | POA: Insufficient documentation

## 2020-01-11 DIAGNOSIS — Z923 Personal history of irradiation: Secondary | ICD-10-CM | POA: Diagnosis not present

## 2020-01-11 DIAGNOSIS — Z79899 Other long term (current) drug therapy: Secondary | ICD-10-CM | POA: Insufficient documentation

## 2020-01-11 DIAGNOSIS — M1990 Unspecified osteoarthritis, unspecified site: Secondary | ICD-10-CM | POA: Insufficient documentation

## 2020-01-11 DIAGNOSIS — C50111 Malignant neoplasm of central portion of right female breast: Secondary | ICD-10-CM | POA: Insufficient documentation

## 2020-01-11 DIAGNOSIS — I10 Essential (primary) hypertension: Secondary | ICD-10-CM | POA: Insufficient documentation

## 2020-01-11 DIAGNOSIS — Z170 Estrogen receptor positive status [ER+]: Secondary | ICD-10-CM | POA: Diagnosis not present

## 2020-01-11 DIAGNOSIS — M8580 Other specified disorders of bone density and structure, unspecified site: Secondary | ICD-10-CM | POA: Diagnosis not present

## 2020-01-11 DIAGNOSIS — Z9223 Personal history of estrogen therapy: Secondary | ICD-10-CM | POA: Diagnosis not present

## 2020-07-13 ENCOUNTER — Encounter: Payer: Self-pay | Admitting: Radiation Oncology

## 2020-07-13 ENCOUNTER — Other Ambulatory Visit: Payer: Self-pay

## 2020-07-13 ENCOUNTER — Ambulatory Visit
Admission: RE | Admit: 2020-07-13 | Discharge: 2020-07-13 | Disposition: A | Discharge status: Home / Self Care | Payer: Medicare Other | Source: Ambulatory Visit | Attending: Radiation Oncology | Admitting: Radiation Oncology

## 2020-07-13 ENCOUNTER — Other Ambulatory Visit: Payer: Self-pay | Admitting: *Deleted

## 2020-07-13 VITALS — BP 160/78 | HR 79 | Temp 97.5°F | Wt 157.0 lb

## 2020-07-13 DIAGNOSIS — Z853 Personal history of malignant neoplasm of breast: Secondary | ICD-10-CM | POA: Diagnosis present

## 2020-07-13 DIAGNOSIS — Z923 Personal history of irradiation: Secondary | ICD-10-CM | POA: Insufficient documentation

## 2020-07-13 DIAGNOSIS — Z170 Estrogen receptor positive status [ER+]: Secondary | ICD-10-CM

## 2020-07-13 NOTE — Progress Notes (Signed)
  Radiation Oncology Follow up Note  Name: Katherine Gates   Date:   07/13/2020 MRN:  [id number] DOB: 12-10-1933    This 86 y.o. female presents to the clinic today for 3-1/2-year follow-up status post whole breast radiation to her right breast for stage I ER/PR positive base of mammary carcinoma.  REFERRING PROVIDER: Idelle Crouch, MD  HPI: Patient is a 86 year old female now 3-1/2 years having completed whole breast radiation to her right breast for stage I ER/PR positive invasive mammary carcinoma.  Seen today in routine follow-up she is doing well.  She specifically denies breast tenderness cough or bone pain..  She mammograms performed back in January which I have reviewed were BI-RADS 2 benign.  Patient discontinued Aromasin over a year ago based on hot flashes and side effect profile she is not on antiestrogen therapy at this time.  COMPLICATIONS OF TREATMENT: none  FOLLOW UP COMPLIANCE: keeps appointments   PHYSICAL EXAM:  BP (!) 160/78 (BP Location: Left Arm, Patient Position: Sitting, Cuff Size: Normal)   Pulse 79   Temp (!) 97.5 F (36.4 C) (Tympanic)   Wt 157 lb (71.2 kg)   BMI 28.72 kg/m  Lungs are clear to A&P cardiac examination essentially unremarkable with regular rate and rhythm. No dominant mass or nodularity is noted in either breast in 2 positions examined. Incision is well-healed. No axillary or supraclavicular adenopathy is appreciated. Cosmetic result is excellent.  Well-developed well-nourished patient in NAD. HEENT reveals PERLA, EOMI, discs not visualized.  Oral cavity is clear. No oral mucosal lesions are identified. Neck is clear without evidence of cervical or supraclavicular adenopathy. Lungs are clear to A&P. Cardiac examination is essentially unremarkable with regular rate and rhythm without murmur rub or thrill. Abdomen is benign with no organomegaly or masses noted. Motor sensory and DTR levels are equal and symmetric in the upper and lower extremities.  Cranial nerves II through XII are grossly intact. Proprioception is intact. No peripheral adenopathy or edema is identified. No motor or sensory levels are noted. Crude visual fields are within normal range.  RADIOLOGY RESULTS: Mammograms reviewed compatible with above-stated findings  PLAN: Present time patient is doing well with no evidence of disease now 3-1/2 years out.  I am pleased with her overall progress.  I have asked to see her back in 1 year for follow-up and then I will discontinue follow-up care.  Patient knows to call with any concerns.  I would like to take this opportunity to thank you for allowing me to participate in the care of your patient.Noreene Filbert, MD

## 2020-12-20 ENCOUNTER — Encounter: Payer: Self-pay | Admitting: *Deleted

## 2021-01-03 ENCOUNTER — Ambulatory Visit
Admission: RE | Admit: 2021-01-03 | Discharge: 2021-01-03 | Disposition: A | Discharge status: Home / Self Care | Payer: Medicare Other | Source: Ambulatory Visit | Attending: Oncology | Admitting: Oncology

## 2021-01-03 ENCOUNTER — Other Ambulatory Visit: Payer: Self-pay

## 2021-01-03 DIAGNOSIS — C50111 Malignant neoplasm of central portion of right female breast: Secondary | ICD-10-CM

## 2021-01-03 DIAGNOSIS — Z170 Estrogen receptor positive status [ER+]: Secondary | ICD-10-CM | POA: Diagnosis present

## 2021-01-06 NOTE — Progress Notes (Deleted)
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  INTERVAL HISTORY: Patient returns to clinic today for routine 54-monthevaluation and discussion of her mammogram results.  She continues to have chronic right shoulder pain, but otherwise feels well.  She has no neurologic complaints.  She denies any recent fevers or illnesses.  She has a good appetite and denies weight loss.  She denies any chest pain, shortness of breath, cough, or hemoptysis.  She denies any nausea, vomiting, constipation, or diarrhea. She has no urinary complaints.  Patient offers no further specific complaints today.  REVIEW OF SYSTEMS:   Review of Systems  Constitutional: Negative.  Negative for fever, malaise/fatigue and weight loss.  Respiratory: Negative.  Negative for cough, hemoptysis and shortness of breath.   Cardiovascular: Negative.  Negative for chest pain and leg swelling.  Gastrointestinal: Negative.  Negative for abdominal pain.  Genitourinary: Negative.  Negative for dysuria.  Musculoskeletal: Positive for joint pain. Negative for back pain.  Skin: Negative.  Negative for rash.  Neurological: Negative.  Negative for dizziness, sensory change, focal weakness, weakness and headaches.  Psychiatric/Behavioral: Negative.  The patient is not nervous/anxious and does not have insomnia.     As per HPI. Otherwise, a complete review of systems is negative.  PAST MEDICAL HISTORY: Past Medical History:  Diagnosis Date  . Bell's palsy   . [place]   [place]  . Dermatitis contact, eyelid   . Environmental allergies   . Fibrocystic breast disease    [facility]  . GERD (gastroesophageal  reflux disease)   . HOH (hard of hearing)   . IBS (irritable bowel syndrome)   . [place] of [place] ([place]   left upper arm  . Neuropathy    JUST IN LEGS  . Osteoarthritis   . Personal history of radiation therapy 2017   right breast ca  . Rosacea     PAST SURGICAL HISTORY: Past Surgical History:  Procedure Laterality Date  . ABDOMINAL HYSTERECTOMY    . BACK SURGERY  01/12/2019   L3,4,5 with titanium screws  . BREAST BIOPSY Right 02/29/2016   mucinous ca  . BREAST LUMPECTOMY Right 03/16/2016   mucinous ca clear margins and negative LN  . ENDOSCOPIC CARPAL TUNNEL RELEASE Bilateral   . EYE SURGERY     bilateral cataract  . eyelid lift    . LUMBAR FUSION  01/12/2019  . PARTIAL MASTECTOMY WITH NEEDLE LOCALIZATION Right 03/16/2016   Procedure: PARTIAL MASTECTOMY;  Surgeon: JLeonie Green MD;  Location: [facility] ORS;  Service: General;  Laterality: Right;  . repair tear ducts    . SENTINEL NODE BIOPSY Right 03/16/2016   Procedure: SENTINEL LYMPH NODE BIOPSY;  Surgeon: JLeonie Green MD;  Location: [facility] ORS;  Service: General;  Laterality: Right;  . SHOULDER ARTHROSCOPY WITH ROTATOR CUFF REPAIR Right 07/15/2019   Procedure: SHOULDER ARTHROSCOPY WITH ROTATOR CUFF REPAIR;  Surgeon: BLovell Sheehan MD;  Location: [facility] ORS;  Service: Orthopedics;  Laterality: Right;    FAMILY HISTORY Family History  Problem Relation Age of Onset  . Skin cancer Brother        non-melanoma  . Skin cancer Father        non-melanoma  .  Throat cancer Father   . Stroke Mother   . Breast cancer Neg Hx        ADVANCED DIRECTIVES:    HEALTH MAINTENANCE: Social History   Tobacco Use  . Smoking status: Never Smoker  . Smokeless tobacco: Never Used  Vaping Use  . Vaping Use: Never used  Substance Use Topics  . Alcohol use: No    Alcohol/week: 0.0 standard drinks  . Drug use: No     Colonoscopy:  PAP:  Bone density:  Lipid panel:  Allergies  Allergen Reactions  . Benzalkonium  Chloride Other (See Comments)    Positive patch test  . Codeine Nausea And Vomiting  . Garlic Diarrhea  . Methylisothiazolinone Other (See Comments)    Positive patch test     Current Outpatient Medications  Medication Sig Dispense Refill  . aspirin EC 81 MG tablet Take 81 mg by mouth daily.     . cyanocobalamin 1000 MCG tablet Take 1,000 mcg by mouth daily.     . fexofenadine (ALLEGRA) 180 MG tablet Take 180 mg by mouth daily.     . fluticasone (FLONASE) 50 MCG/ACT nasal spray Place 2 sprays into both nostrils daily.  (Patient not taking: Reported on 07/13/2020)    . meloxicam (MOBIC) 15 MG tablet Take 1 tablet (15 mg total) by mouth daily. (Patient not taking: Reported on 07/13/2020) 30 tablet 3  . metroNIDAZOLE (FLAGYL) 500 MG tablet metronidazole 500 mg tablet (Patient not taking: Reported on 07/13/2020)    . ondansetron (ZOFRAN ODT) 4 MG disintegrating tablet Allow 1-2 tablets to dissolve in your mouth every 8 hours as needed for nausea/vomiting (Patient not taking: Reported on 07/13/2020) 30 tablet 0  . pantoprazole (PROTONIX) 40 MG tablet Take 40 mg by mouth 2 (two) times daily.     Vladimir Faster Glycol-Propyl Glycol (SYSTANE ULTRA PF OP) Place 1 drop into both eyes 2 (two) times a day. (Patient not taking: Reported on 07/13/2020)     No current facility-administered medications for this visit.    OBJECTIVE: There were no vitals filed for this visit.   There is no height or weight on file to calculate BMI.    ECOG FS:0 - Asymptomatic  General: Well-developed, well-nourished, no acute distress. Eyes: Pink conjunctiva, anicteric sclera. HEENT: Normocephalic, moist mucous membranes. Breast: Exam deferred today. Lungs: No audible wheezing or coughing. Heart: Regular rate and rhythm. Abdomen: Soft, nontender, no obvious distention. Musculoskeletal: No edema, cyanosis, or clubbing. Neuro: Alert, answering all questions appropriately. Cranial nerves grossly intact. Skin: No rashes or  petechiae noted. Psych: Normal affect.   LAB RESULTS:  Lab Results  Component Value Date   NA 138 07/24/2019   K 4.1 07/24/2019   CL 106 07/24/2019   CO2 22 07/24/2019   GLUCOSE 128 (H) 07/24/2019   BUN 18 07/24/2019   CREATININE 0.90 11/06/2019   CALCIUM 8.9 07/24/2019   PROT 7.0 07/24/2019   ALBUMIN 3.5 07/24/2019   AST 30 07/24/2019   ALT 26 07/24/2019   ALKPHOS 96 07/24/2019   BILITOT 0.7 07/24/2019   GFRNONAA 59 (L) 07/24/2019   GFRAA >60 07/24/2019    Lab Results  Component Value Date   WBC 13.9 (H) 07/24/2019   NEUTROABS 10.6 (H) 07/24/2019   HGB 12.5 07/24/2019   HCT 38.2 07/24/2019   MCV 87.8 07/24/2019   PLT 270 07/24/2019      STUDIES: MM DIAG BREAST TOMO BILATERAL  Result Date: 01/03/2021 CLINICAL DATA:  Right lumpectomy.  Annual mammography.  EXAM: DIGITAL DIAGNOSTIC BILATERAL MAMMOGRAM WITH TOMO AND CAD TECHNIQUE: Bilateral digital diagnostic mammography and breast tomosynthesis was performed. Digital images of the breasts were evaluated with computer-aided detection. COMPARISON:  Previous exam(s). ACR Breast Density Category b: There are scattered areas of fibroglandular density. FINDINGS: No suspicious masses, calcifications, or distortion. The right lumpectomy site is stable. IMPRESSION: No mammographic evidence of malignancy. RECOMMENDATION: Recommend annual mammography. The patient may proceed with annual screening or diagnostic mammography at the discretion of her and her referring physician. I have discussed the findings and recommendations with the patient. If applicable, a reminder letter will be sent to the patient regarding the next appointment. BI-RADS CATEGORY  2: Benign. 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 Mammoprint low risk.  PLAN:    1. Pathologic stage IA ER/PR positive, HER-2 negative  adenocarcinoma the central portion of right breast: Final pathology results as above with a low risk Mammoprint therefore chemotherapy was not necessary. Patient completed adjuvant XRT in August 2017.  Patient discontinued both letrozole and Aromasin secondary to a significant side effects and only completed approximately 2 years of aromatase inhibitor treatment.  Patient expressed understanding that her risk of recurrence may increase by not completing 5 years of treatment.  No further intervention is needed at this time.  Her most recent mammogram on December 31, 2019 was reported as BI-RADS 2.  Repeat in January 2022.  Return to clinic in 1 year for routine evaluation.    2.  Osteopenia: Bone marrow density completed on August 04, 2018 reported T score of -1.3.  This is slightly worse than 2 years prior when her T score was normal at -0.5.  Have recommended patient take calcium and vitamin D supplementation.  Continue monitoring per primary care. 3.  Shoulder pain: Continue follow-up with orthopedics as indicated. 4.  Hypertension: Patient's blood pressure is significantly elevated today.  Continue monitoring and treatment per primary care.   Patient expressed understanding and was in agreement with this plan. She also understands that She can call clinic at any time with any questions, concerns, or complaints.    Lloyd Huger, MD   01/06/2021 6:44 AM

## 2021-01-10 ENCOUNTER — Inpatient Hospital Stay: Payer: Medicare Other | Admitting: Oncology

## 2021-01-10 DIAGNOSIS — Z170 Estrogen receptor positive status [ER+]: Secondary | ICD-10-CM

## 2021-01-16 NOTE — Progress Notes (Signed)
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  INTERVAL HISTORY: Patient returns to clinic today for routine yearly evaluation.  She continues to feel well and remains asymptomatic.  She has occasional right shoulder pain that is chronic and unchanged. She has no neurologic complaints.  She denies any recent fevers or illnesses.  She has a good appetite and denies weight loss.  She denies any chest pain, shortness of breath, cough, or hemoptysis.  She denies any nausea, vomiting, constipation, or diarrhea. She has no urinary complaints.  Patient feels at her baseline offers no specific complaints today.  REVIEW OF SYSTEMS:   Review of Systems  Constitutional: Negative.  Negative for fever, malaise/fatigue and weight loss.  Respiratory: Negative.  Negative for cough, hemoptysis and shortness of breath.   Cardiovascular: Negative.  Negative for chest pain and leg swelling.  Gastrointestinal: Negative.  Negative for abdominal pain.  Genitourinary: Negative.  Negative for dysuria.  Musculoskeletal: Positive for joint pain. Negative for back pain.  Skin: Negative.  Negative for rash.  Neurological: Negative.  Negative for dizziness, sensory change, focal weakness, weakness and headaches.  Psychiatric/Behavioral: Negative.  The patient is not nervous/anxious and does not have insomnia.     As per HPI. Otherwise, a complete review of systems is negative.  PAST MEDICAL HISTORY: Past Medical History:  Diagnosis Date  . Bell's palsy   . Breast cancer ([place]) 2017   right  . Dermatitis contact, eyelid   . Environmental allergies   . Fibrocystic breast disease    being followed at [facility]   . GERD  (gastroesophageal reflux disease)   . HOH (hard of hearing)   . IBS (irritable bowel syndrome)   . Melanoma of skin ([place]) 2011   left upper arm  . Neuropathy    JUST IN LEGS  . Osteoarthritis   . Personal history of radiation therapy 2017   right breast ca  . Rosacea     PAST SURGICAL HISTORY: Past Surgical History:  Procedure Laterality Date  . ABDOMINAL HYSTERECTOMY    . BACK SURGERY  01/12/2019   L3,4,5 with titanium screws  . BREAST BIOPSY Right 02/29/2016   mucinous ca  . BREAST LUMPECTOMY Right 03/16/2016   mucinous ca clear margins and negative LN  . ENDOSCOPIC CARPAL TUNNEL RELEASE Bilateral   . EYE SURGERY     bilateral cataract  . eyelid lift    . LUMBAR FUSION  01/12/2019  . PARTIAL MASTECTOMY WITH NEEDLE LOCALIZATION Right 03/16/2016   Procedure: PARTIAL MASTECTOMY;  Surgeon: Leonie Green, MD;  Location: [facility] ORS;  Service: General;  Laterality: Right;  . repair tear ducts    . SENTINEL NODE BIOPSY Right 03/16/2016   Procedure: SENTINEL LYMPH NODE BIOPSY;  Surgeon: Leonie Green, MD;  Location: [facility] ORS;  Service: General;  Laterality: Right;  . SHOULDER ARTHROSCOPY WITH ROTATOR CUFF REPAIR Right 07/15/2019   Procedure: SHOULDER ARTHROSCOPY WITH ROTATOR CUFF REPAIR;  Surgeon: Lovell Sheehan, MD;  Location: [facility] ORS;  Service: Orthopedics;  Laterality: Right;    FAMILY HISTORY Family History  Problem Relation Age of Onset  . Skin cancer Brother        non-melanoma  . Skin cancer Father  non-melanoma  . Throat cancer Father   . Stroke Mother   . Breast cancer Neg Hx        ADVANCED DIRECTIVES:    HEALTH MAINTENANCE: Social History   Tobacco Use  . Smoking status: Never Smoker  . Smokeless tobacco: Never Used  Vaping Use  . Vaping Use: Never used  Substance Use Topics  . Alcohol use: No    Alcohol/week: 0.0 standard drinks  . Drug use: No     Colonoscopy:  PAP:  Bone density:  Lipid panel:  Allergies  Allergen Reactions   . Benzalkonium Chloride Other (See Comments)    Positive patch test  . Codeine Nausea And Vomiting  . Garlic Diarrhea  . Methylisothiazolinone Other (See Comments)    Positive patch test     Current Outpatient Medications  Medication Sig Dispense Refill  . aspirin EC 81 MG tablet Take 81 mg by mouth daily.     . cyanocobalamin 1000 MCG tablet Take 1,000 mcg by mouth daily.     . fexofenadine (ALLEGRA) 180 MG tablet Take 180 mg by mouth daily.     . fluticasone (FLONASE) 50 MCG/ACT nasal spray Place 2 sprays into both nostrils daily.    . isosorbide mononitrate (IMDUR) 30 MG 24 hr tablet Take by mouth.    . pantoprazole (PROTONIX) 40 MG tablet Take 40 mg by mouth 2 (two) times daily.     Vladimir Faster Glycol-Propyl Glycol (SYSTANE ULTRA PF OP) Place 1 drop into both eyes 2 (two) times a day.    Marland Kitchen acetaminophen (TYLENOL) 650 MG CR tablet Take by mouth.    . ondansetron (ZOFRAN ODT) 4 MG disintegrating tablet Allow 1-2 tablets to dissolve in your mouth every 8 hours as needed for nausea/vomiting (Patient not taking: No sig reported) 30 tablet 0   No current facility-administered medications for this visit.    OBJECTIVE: Vitals:   01/17/21 1006  BP: (!) 164/70  Pulse: 83  Resp: 18  Temp: (!) 96.8 F (36 C)     Body mass index is 29.39 kg/m.    ECOG FS:0 - Asymptomatic  General: Well-developed, well-nourished, no acute distress. Eyes: Pink conjunctiva, anicteric sclera. HEENT: Normocephalic, moist mucous membranes. Breast: Exam deferred today. Lungs: No audible wheezing or coughing. Heart: Regular rate and rhythm. Abdomen: Soft, nontender, no obvious distention. Musculoskeletal: No edema, cyanosis, or clubbing. Neuro: Alert, answering all questions appropriately. Cranial nerves grossly intact. Skin: No rashes or petechiae noted. Psych: Normal affect.   LAB RESULTS:  Lab Results  Component Value Date   NA 138 07/24/2019   K 4.1 07/24/2019   CL 106 07/24/2019   CO2 22  07/24/2019   GLUCOSE 128 (H) 07/24/2019   BUN 18 07/24/2019   CREATININE 0.90 11/06/2019   CALCIUM 8.9 07/24/2019   PROT 7.0 07/24/2019   ALBUMIN 3.5 07/24/2019   AST 30 07/24/2019   ALT 26 07/24/2019   ALKPHOS 96 07/24/2019   BILITOT 0.7 07/24/2019   GFRNONAA 59 (L) 07/24/2019   GFRAA >60 07/24/2019    Lab Results  Component Value Date   WBC 13.9 (H) 07/24/2019   NEUTROABS 10.6 (H) 07/24/2019   HGB 12.5 07/24/2019   HCT 38.2 07/24/2019   MCV 87.8 07/24/2019   PLT 270 07/24/2019      STUDIES: MM DIAG BREAST TOMO BILATERAL  Result Date: 01/03/2021 CLINICAL DATA:  Right lumpectomy.  Annual mammography. EXAM: DIGITAL DIAGNOSTIC BILATERAL MAMMOGRAM WITH TOMO AND CAD TECHNIQUE: Bilateral digital diagnostic mammography and  breast tomosynthesis was performed. Digital images of the breasts were evaluated with computer-aided detection. COMPARISON:  Previous exam(s). ACR Breast Density Category b: There are scattered areas of fibroglandular density. FINDINGS: No suspicious masses, calcifications, or distortion. The right lumpectomy site is stable. IMPRESSION: No mammographic evidence of malignancy. RECOMMENDATION: Recommend annual mammography. The patient may proceed with annual screening or diagnostic mammography at the discretion of her and her referring physician. I have discussed the findings and recommendations with the patient. If applicable, a reminder letter will be sent to the patient regarding the next appointment. BI-RADS CATEGORY  2: Benign. 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 Mammoprint low risk.  PLAN:    1. 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 Patient  completed adjuvant XRT in August 2017.  Patient discontinued both letrozole and Aromasin secondary to a significant side effects and only completed approximately 2 years of aromatase inhibitor treatment.  Patient expressed understanding that her risk of recurrence may increase by not completing 5 years of treatment.  No further intervention is needed at this time.  Patient's most recent mammogram on January 03, 2021 was reported as BI-RADS 2.  Patient is now 5 years removed from her treatment.  After discussion with the patient it was agreed upon that no further follow-up is necessary and her mammograms can continue to be ordered by her primary care physician.  Please refer patient back if there are any questions or concerns.   2.  Osteopenia: Bone marrow density completed on August 04, 2018 reported T score of -1.3.  This is slightly worse than 2 years prior when her T score was normal at -0.5.  Have recommended patient take calcium and vitamin D supplementation.  Continue monitoring per primary care. 3.  Shoulder pain: Chronic and unchanged. 4.  Hypertension: Moderate.  Continue monitoring and treatment per primary care.   Patient expressed understanding and was in agreement with this plan. She also understands that She can call clinic at any time with any questions, concerns, or complaints.    Lloyd Huger, MD   01/17/2021 1:39 PM

## 2021-01-17 ENCOUNTER — Encounter: Payer: Self-pay | Admitting: Oncology

## 2021-01-17 ENCOUNTER — Inpatient Hospital Stay: Payer: Medicare Other | Attending: Oncology | Admitting: Oncology

## 2021-01-17 VITALS — BP 164/70 | HR 83 | Temp 96.8°F | Resp 18 | Wt 160.7 lb

## 2021-01-17 DIAGNOSIS — Z85820 Personal history of malignant melanoma of skin: Secondary | ICD-10-CM | POA: Diagnosis not present

## 2021-01-17 DIAGNOSIS — Z9223 Personal history of estrogen therapy: Secondary | ICD-10-CM | POA: Insufficient documentation

## 2021-01-17 DIAGNOSIS — Z923 Personal history of irradiation: Secondary | ICD-10-CM | POA: Insufficient documentation

## 2021-01-17 DIAGNOSIS — M25519 Pain in unspecified shoulder: Secondary | ICD-10-CM | POA: Diagnosis not present

## 2021-01-17 DIAGNOSIS — Z853 Personal history of malignant neoplasm of breast: Secondary | ICD-10-CM | POA: Diagnosis not present

## 2021-01-17 DIAGNOSIS — K219 Gastro-esophageal reflux disease without esophagitis: Secondary | ICD-10-CM | POA: Insufficient documentation

## 2021-01-17 DIAGNOSIS — G8929 Other chronic pain: Secondary | ICD-10-CM | POA: Diagnosis not present

## 2021-01-17 DIAGNOSIS — M8580 Other specified disorders of bone density and structure, unspecified site: Secondary | ICD-10-CM | POA: Insufficient documentation

## 2021-01-17 DIAGNOSIS — Z7982 Long term (current) use of aspirin: Secondary | ICD-10-CM | POA: Diagnosis not present

## 2021-01-17 DIAGNOSIS — C50111 Malignant neoplasm of central portion of right female breast: Secondary | ICD-10-CM

## 2021-01-17 DIAGNOSIS — N6011 Diffuse cystic mastopathy of right breast: Secondary | ICD-10-CM | POA: Insufficient documentation

## 2021-01-17 DIAGNOSIS — M1990 Unspecified osteoarthritis, unspecified site: Secondary | ICD-10-CM | POA: Insufficient documentation

## 2021-01-17 DIAGNOSIS — I10 Essential (primary) hypertension: Secondary | ICD-10-CM | POA: Diagnosis not present

## 2021-01-17 DIAGNOSIS — Z79899 Other long term (current) drug therapy: Secondary | ICD-10-CM | POA: Diagnosis not present

## 2021-01-17 DIAGNOSIS — Z170 Estrogen receptor positive status [ER+]: Secondary | ICD-10-CM | POA: Insufficient documentation

## 2021-01-17 NOTE — Progress Notes (Signed)
Pt in for yearly follow up, to see surgeon this afternoon and will have breast exam done then.

## 2021-07-13 ENCOUNTER — Ambulatory Visit: Payer: Medicare Other | Admitting: Radiation Oncology

## 2021-07-27 ENCOUNTER — Encounter: Payer: Self-pay | Admitting: Radiation Oncology

## 2021-07-27 ENCOUNTER — Other Ambulatory Visit: Payer: Self-pay

## 2021-07-27 ENCOUNTER — Ambulatory Visit
Admission: RE | Admit: 2021-07-27 | Discharge: 2021-07-27 | Disposition: A | Discharge status: Home / Self Care | Payer: Medicare Other | Source: Ambulatory Visit | Attending: Radiation Oncology | Admitting: Radiation Oncology

## 2021-07-27 VITALS — BP 156/69 | HR 71 | Temp 98.1°F | Wt 160.6 lb

## 2021-07-27 DIAGNOSIS — Z170 Estrogen receptor positive status [ER+]: Secondary | ICD-10-CM

## 2021-07-27 NOTE — Progress Notes (Signed)
Radiation Oncology Follow up Note  Name: Katherine Gates   Date:   07/27/2021 MRN:  [id number] DOB: 09-Dec-1933    This 87 y.o. female presents to the clinic today for 4 and half year follow-up status post whole breast radiation to her right breast for stage I ER/PR positive invasive mammary carcinoma.  REFERRING PROVIDER: Idelle Crouch, MD  HPI: Patient is a 87 year old female now about 4 and half years having completed whole breast radiation to her right breast for stage I ER/PR positive invasive mammary carcinoma.  Seen today in routine follow-up she is doing well.  She specifically denies breast tenderness cough or bone pain..  She is not on antiestrogen therapy.  She had mammograms back in January which I have reviewed were BI-RADS 2 benign. COMPLICATIONS OF TREATMENT: none  FOLLOW UP COMPLIANCE: keeps appointments   PHYSICAL EXAM:  BP (!) 156/69   Pulse 71   Temp 98.1 F (36.7 C) (Tympanic)   Wt 160 lb 9.6 oz (72.8 kg)   BMI 29.37 kg/m  Lungs are clear to A&P cardiac examination essentially unremarkable with regular rate and rhythm. No dominant mass or nodularity is noted in either breast in 2 positions examined. Incision is well-healed. No axillary or supraclavicular adenopathy is appreciated. Cosmetic result is excellent.  Well-developed well-nourished patient in NAD. HEENT reveals PERLA, EOMI, discs not visualized.  Oral cavity is clear. No oral mucosal lesions are identified. Neck is clear without evidence of cervical or supraclavicular adenopathy. Lungs are clear to A&P. Cardiac examination is essentially unremarkable with regular rate and rhythm without murmur rub or thrill. Abdomen is benign with no organomegaly or masses noted. Motor sensory and DTR levels are equal and symmetric in the upper and lower extremities. Cranial nerves II through XII are grossly intact. Proprioception is intact. No peripheral adenopathy or edema is identified. No motor or sensory levels are noted.  Crude visual fields are within normal range.  RADIOLOGY RESULTS: Mammograms reviewed compatible with above-stated findings  PLAN: Present time she is now close to 5 years out with no evidence of disease.  And pleased with her overall progress.  Of asked for her to continue follow-up care with her PMD.  I be happy to reevaluate her anytime should further oncology consultation be indicated.  I would like to take this opportunity to thank you for allowing me to participate in the care of your patient.Noreene Filbert, MD

## 2021-11-17 ENCOUNTER — Other Ambulatory Visit: Payer: Self-pay | Admitting: General Surgery

## 2021-11-17 DIAGNOSIS — Z1231 Encounter for screening mammogram for malignant neoplasm of breast: Secondary | ICD-10-CM

## 2022-01-04 ENCOUNTER — Ambulatory Visit
Admission: RE | Admit: 2022-01-04 | Discharge: 2022-01-04 | Disposition: A | Discharge status: Home / Self Care | Payer: Medicare Other | Source: Ambulatory Visit | Attending: General Surgery | Admitting: General Surgery

## 2022-01-04 ENCOUNTER — Other Ambulatory Visit: Payer: Self-pay

## 2022-01-04 DIAGNOSIS — Z1231 Encounter for screening mammogram for malignant neoplasm of breast: Secondary | ICD-10-CM | POA: Insufficient documentation

## 2022-11-15 ENCOUNTER — Other Ambulatory Visit: Payer: Self-pay | Admitting: General Surgery

## 2022-11-15 DIAGNOSIS — Z1231 Encounter for screening mammogram for malignant neoplasm of breast: Secondary | ICD-10-CM

## 2022-11-25 ENCOUNTER — Encounter: Payer: Self-pay | Admitting: Emergency Medicine

## 2022-11-25 ENCOUNTER — Emergency Department
Admission: EM | Admit: 2022-11-25 | Discharge: 2022-11-25 | Disposition: A | Discharge status: Home / Self Care | Payer: Medicare Other | Attending: Emergency Medicine | Admitting: Emergency Medicine

## 2022-11-25 ENCOUNTER — Other Ambulatory Visit: Payer: Self-pay

## 2022-11-25 DIAGNOSIS — H5789 Other specified disorders of eye and adnexa: Secondary | ICD-10-CM | POA: Diagnosis present

## 2022-11-25 DIAGNOSIS — H1089 Other conjunctivitis: Secondary | ICD-10-CM | POA: Insufficient documentation

## 2022-11-25 DIAGNOSIS — B9689 Other specified bacterial agents as the cause of diseases classified elsewhere: Secondary | ICD-10-CM | POA: Insufficient documentation

## 2022-11-25 DIAGNOSIS — H1031 Unspecified acute conjunctivitis, right eye: Secondary | ICD-10-CM

## 2022-11-25 MED ORDER — POLYMYXIN B-TRIMETHOPRIM 10000-0.1 UNIT/ML-% OP SOLN: 2.0000 [drp] | Freq: Four times a day (QID) | OPHTHALMIC | 0 refills | Status: DC

## 2022-11-25 NOTE — ED Provider Notes (Signed)
[facility] Provider Note  Patient Contact: 5:29 PM (approximate)   History   Eye Drainage and Eye Problem   HPI  Katherine Gates is a 88 y.o. female who presents to department complaining of eye irritation and drainage from the right eye.  Symptoms began today.  No visual changes.  She wears glasses but does not wear contacts.  No other complaints at this time.     Physical Exam   Triage Vital Signs: ED Triage Vitals  Enc Vitals Group     BP 11/25/22 1623 (!) 158/62     Pulse Rate 11/25/22 1623 64     Resp 11/25/22 1623 18     Temp 11/25/22 1623 98.4 F (36.9 C)     Temp Source 11/25/22 1623 Oral     SpO2 11/25/22 1623 97 %     Weight 11/25/22 1545 158 lb 11.7 oz (72 kg)     Height 11/25/22 1545 '5\' 2"'$  (1.575 m)     Head Circumference --      Peak Flow --      Pain Score 11/25/22 1544 3     Pain Loc --      Pain Edu? --      Excl. in [place]? --     Most recent vital signs: Vitals:   11/25/22 1623  BP: (!) 158/62  Pulse: 64  Resp: 18  Temp: 98.4 F (36.9 C)  SpO2: 97%     General: Alert and in no acute distress. Eyes:  PERRL. EOMI. conjunctival erythema to the right eye.  Purulent drainage noted to the upper and lower eyelashes.  Funduscopic exam is reassuring bilaterally.   Cardiovascular:  Good peripheral perfusion Respiratory: Normal respiratory effort without tachypnea or retractions. Lungs CTAB.  Musculoskeletal: Full range of motion to all extremities.  Neurologic:  No gross focal neurologic deficits are appreciated.  Skin:   No rash noted Other:   ED Results / Procedures / Treatments   Labs (all labs ordered are listed, but only abnormal results are displayed) Labs Reviewed - No data to display   EKG     RADIOLOGY    No results found.  PROCEDURES:  Critical Care performed: No  Procedures   MEDICATIONS ORDERED IN ED: Medications - No data to display   IMPRESSION / MDM / [place] / ED COURSE   I reviewed the triage vital signs and the nursing notes.                              Differential diagnosis includes, but is not limited to, conjunctivitis, preseptal cellulitis, orbital cellulitis.   Patient's presentation is most consistent with acute illness / injury with system symptoms.   Patient's diagnosis is consistent with conjunctivitis.  Patient presents the emergency department with right eye irritation, erythema and purulent drainage.  Findings on physical exam are consistent with bacterial conjunctivitis.  Antibiotic eyedrops prescribed.  Follow-up with primary care as needed.  Return precautions discussed with the patient..  Patient is given ED precautions to return to the ED for any worsening or new symptoms.        FINAL CLINICAL IMPRESSION(S) / ED DIAGNOSES   Final diagnoses:  Acute bacterial conjunctivitis of right eye     Rx / DC Orders   ED Discharge Orders          Ordered    trimethoprim-polymyxin b (POLYTRIM) ophthalmic solution  Every 6 hours        11/25/22 1807             Note:  This document was prepared using Dragon voice recognition software and may include unintentional dictation errors.   Darletta Moll, PA-C 11/25/22 1807    Rada Hay, MD 11/25/22 [phone]

## 2022-11-25 NOTE — ED Triage Notes (Signed)
Pt reports woke up this am and her right eye was red and draining and painful. Pt reports the discomfort is not too bad but the watering and redness is. Pt denies injuroes or loss of vision

## 2023-01-08 ENCOUNTER — Ambulatory Visit
Admission: RE | Admit: 2023-01-08 | Discharge: 2023-01-08 | Disposition: A | Discharge status: Home / Self Care | Payer: Medicare Other | Source: Ambulatory Visit | Attending: General Surgery | Admitting: General Surgery

## 2023-01-08 DIAGNOSIS — Z1231 Encounter for screening mammogram for malignant neoplasm of breast: Secondary | ICD-10-CM | POA: Insufficient documentation

## 2023-02-20 ENCOUNTER — Ambulatory Visit (INDEPENDENT_AMBULATORY_CARE_PROVIDER_SITE_OTHER): Payer: Medicare Other | Admitting: Podiatry

## 2023-02-20 ENCOUNTER — Encounter: Payer: Self-pay | Admitting: Podiatry

## 2023-02-20 DIAGNOSIS — L600 Ingrowing nail: Secondary | ICD-10-CM

## 2023-02-20 MED ORDER — NEOMYCIN-POLYMYXIN-HC 1 % OT SOLN: OTIC | 1 refills | Status: DC

## 2023-02-20 NOTE — Patient Instructions (Signed)

## 2023-02-20 NOTE — Progress Notes (Signed)
Subjective:  Patient ID: Katherine Gates, female    DOB: 17-Sep-1933,  MRN: [id number] HPI Chief Complaint  Patient presents with   Nail Problem    Hallux left - thick, discolored toenail, wants removed   New Patient (Initial Visit)    Est pt 87    89 y.o. female presents with the above complaint.   ROS: Denies fever chills nausea vomit muscle aches pains calf pain back pain chest pain shortness of breath.  Past Medical History:  Diagnosis Date   Bell's palsy    Breast cancer ([place]) 2017   right   Dermatitis contact, eyelid    Environmental allergies    Fibrocystic breast disease    being [facility]     GERD (gastroesophageal reflux disease)    HOH (hard of hearing)    IBS (irritable bowel syndrome)    Melanoma of skin ([place]) 2011   left upper arm   Neuropathy    JUST IN LEGS   Osteoarthritis    Personal history of radiation therapy 2017   right breast ca   Rosacea    Past Surgical History:  Procedure Laterality Date   ABDOMINAL HYSTERECTOMY     BACK SURGERY  01/12/2019   L3,4,5 with titanium screws   BREAST BIOPSY Right 02/29/2016   mucinous ca   BREAST LUMPECTOMY Right 03/16/2016   mucinous ca clear margins and negative LN   ENDOSCOPIC CARPAL TUNNEL RELEASE Bilateral    EYE SURGERY     bilateral cataract   eyelid lift     LUMBAR FUSION  01/12/2019   PARTIAL MASTECTOMY WITH NEEDLE LOCALIZATION Right 03/16/2016   Procedure: PARTIAL MASTECTOMY;  Surgeon: Katherine Green, MD;  Location: [facility] ORS;  Service: General;  Laterality: Right;   repair tear ducts     SENTINEL NODE BIOPSY Right 03/16/2016   Procedure: SENTINEL LYMPH NODE BIOPSY;  Surgeon: Katherine Green, MD;  Location: [facility] ORS;  Service: General;  Laterality: Right;   SHOULDER ARTHROSCOPY WITH ROTATOR CUFF REPAIR Right 07/15/2019   Procedure: SHOULDER ARTHROSCOPY WITH ROTATOR CUFF REPAIR;  Surgeon: Lovell Sheehan, MD;  Location: [facility] ORS;  Service: Orthopedics;  Laterality: Right;    Current  Outpatient Medications:    imipramine (TOFRANIL) 10 MG tablet, Take 10 mg by mouth at bedtime., Disp: , Rfl:    NEOMYCIN-POLYMYXIN-HYDROCORTISONE (CORTISPORIN) 1 % SOLN OTIC solution, Apply 1-2 drops to toe BID after soaking, Disp: 10 mL, Rfl: 1   acetaminophen (TYLENOL) 650 MG CR tablet, Take by mouth., Disp: , Rfl:    aspirin EC 81 MG tablet, Take 81 mg by mouth daily. , Disp: , Rfl:    cyanocobalamin 1000 MCG tablet, Take 1,000 mcg by mouth daily. , Disp: , Rfl:    fexofenadine (ALLEGRA) 180 MG tablet, Take 180 mg by mouth daily. , Disp: , Rfl:    fluticasone (FLONASE) 50 MCG/ACT nasal spray, Place 2 sprays into both nostrils daily., Disp: , Rfl:    isosorbide mononitrate (IMDUR) 30 MG 24 hr tablet, Take by mouth., Disp: , Rfl:    levothyroxine (SYNTHROID) 50 MCG tablet, Take 50 mcg by mouth every morning., Disp: , Rfl:    pantoprazole (PROTONIX) 40 MG tablet, Take 40 mg by mouth 2 (two) times daily. , Disp: , Rfl:    Polyethyl Glycol-Propyl Glycol (SYSTANE ULTRA PF OP), Place 1 drop into both eyes 2 (two) times a day., Disp: , Rfl:    trimethoprim-polymyxin b (POLYTRIM) ophthalmic solution, Place 2 drops into  the right eye every 6 (six) hours., Disp: 10 mL, Rfl: 0  Allergies  Allergen Reactions   Benzalkonium Chloride Other (See Comments)    Positive patch test   Codeine Nausea And Vomiting   Garlic Diarrhea   Methylisothiazolinone Other (See Comments)    Positive patch test    Review of Systems Objective:  There were no vitals filed for this visit.  General: Well developed, nourished, in no acute distress, alert and oriented x3   Dermatological: Skin is warm, dry and supple bilateral. Nails x 10 are well maintained; remaining integument appears unremarkable at this time. There are no open sores, no preulcerative lesions, no rash or signs of infection present.  Sharp incurvated nail margin along the tibia fibular border of the hallux left this nail has been worked on several times  and is now thick and dystrophic and raised distally.  It is painful with shoe gear and putting on close.  Vascular: Dorsalis Pedis artery and Posterior Tibial artery pedal pulses are 2/4 bilateral with immedate capillary fill time. Pedal hair growth present. No varicosities and no lower extremity edema present bilateral.   Neruologic: Grossly intact via light touch bilateral. Vibratory intact via tuning fork bilateral. Protective threshold with Semmes Wienstein monofilament intact to all pedal sites bilateral. Patellar and Achilles deep tendon reflexes 2+ bilateral. No Babinski or clonus noted bilateral.   Musculoskeletal: No gross boney pedal deformities bilateral. No pain, crepitus, or limitation noted with foot and ankle range of motion bilateral. Muscular strength 5/5 in all groups tested bilateral.  Gait: Unassisted, Nonantalgic.    Radiographs:  None taken  Assessment & Plan:   Assessment: Painful hallux nail left.  Plan: Total nail avulsion with total matrixectomy hallux left this was performed after local anesthetic was administered.  Tolerated procedure well without complications.  She was given both oral and home-going instruction for care and soaking of the toe as well as a prescription of Cortisporin Otic to be applied twice daily after soaking.  I will follow-up with her in 2 weeks     Kaivon Livesey T. [place], [place]

## 2023-03-20 ENCOUNTER — Encounter: Payer: Self-pay | Admitting: Podiatry

## 2023-03-20 ENCOUNTER — Ambulatory Visit (INDEPENDENT_AMBULATORY_CARE_PROVIDER_SITE_OTHER): Payer: Medicare Other | Admitting: Podiatry

## 2023-03-20 DIAGNOSIS — L600 Ingrowing nail: Secondary | ICD-10-CM

## 2023-03-20 DIAGNOSIS — Z9889 Other specified postprocedural states: Secondary | ICD-10-CM

## 2023-03-20 NOTE — Progress Notes (Signed)
She presents today for follow-up of her nail check hallux left with a nail was removed.  She is doing very well states that is been great of had no problems whatsoever.  There is no erythema Dem salines drainage or odor it appears to be nearly 100% healed at this point.  No signs of infection.  Assessment: Well-healing surgical toe.  Plan: Follow-up with [place] on an as-needed basis.

## 2023-03-28 ENCOUNTER — Other Ambulatory Visit: Payer: Self-pay | Admitting: Physician Assistant

## 2023-03-28 DIAGNOSIS — M5416 Radiculopathy, lumbar region: Secondary | ICD-10-CM

## 2023-03-29 ENCOUNTER — Ambulatory Visit
Admission: RE | Admit: 2023-03-29 | Discharge: 2023-03-29 | Disposition: A | Discharge status: Home / Self Care | Payer: Medicare Other | Source: Ambulatory Visit | Attending: Physician Assistant | Admitting: Physician Assistant

## 2023-03-29 DIAGNOSIS — M5416 Radiculopathy, lumbar region: Secondary | ICD-10-CM

## 2023-07-08 ENCOUNTER — Other Ambulatory Visit: Payer: Self-pay | Admitting: Neurological Surgery

## 2023-07-10 ENCOUNTER — Other Ambulatory Visit: Payer: Self-pay | Admitting: Neurological Surgery

## 2023-07-16 NOTE — Pre-Procedure Instructions (Signed)
Surgical Instructions   Your procedure is scheduled on July 25, 2023. Report to [facility] Main Entrance "A" at 5:30 A.M., then check in with the Admitting office. Any questions or running late day of surgery: call [phone]  Questions prior to your surgery date: call [phone], Monday-Friday, 8am-4pm. If you experience any cold or flu symptoms such as cough, fever, chills, shortness of breath, etc. between now and your scheduled surgery, please notify us at the above number.     Remember:  Do not eat or drink after midnight the night before your surgery    Take these medicines the morning of surgery with A SIP OF WATER: fexofenadine (ALLEGRA)  levothyroxine (SYNTHROID)  pregabalin (LYRICA)    May take these medicines IF NEEDED: acetaminophen (TYLENOL)  fluticasone (FLONASE) nasal spray  pantoprazole (PROTONIX)  Polyethyl Glycol-Propyl Glycol (SYSTANE ULTRA PF OP) eye drops   Follow your surgeon's instructions on when to stop Aspirin.  If no instructions were given by your surgeon then you will need to call the office to get those instructions.     One week prior to surgery, STOP taking any Aleve, Naproxen, Ibuprofen, Motrin, Advil, Goody's, BC's, all herbal medications, fish oil, and non-prescription vitamins. This includes your medication: meloxicam (MOBIC)                      Do NOT Smoke (Tobacco/Vaping) for 24 hours prior to your procedure.  If you use a CPAP at night, you may bring your mask/headgear for your overnight stay.   You will be asked to remove any contacts, glasses, piercing's, hearing aid's, dentures/partials prior to surgery. Please bring cases for these items if needed.    Patients discharged the day of surgery will not be allowed to drive home, and someone needs to stay with them for 24 hours.  SURGICAL WAITING ROOM VISITATION Patients may have no more than 2 support people in the waiting area - these visitors may rotate.   Pre-op nurse will  coordinate an appropriate time for 1 ADULT support person, who may not rotate, to accompany patient in pre-op.  Children under the age of 15 must have an adult with them who is not the patient and must remain in the main waiting area with an adult.  If the patient needs to stay at the hospital during part of their recovery, the visitor guidelines for inpatient rooms apply.  Please refer to the [facility] website for the visitor guidelines for any additional information.   If you received a COVID test during your pre-op visit  it is requested that you wear a mask when out in public, stay away from anyone that may not be feeling well and notify your surgeon if you develop symptoms. If you have been in contact with anyone that has tested positive in the last 10 days please notify you surgeon.      Pre-operative 5 CHG Bathing Instructions   You can play a key role in reducing the risk of infection after surgery. Your skin needs to be as free of germs as possible. You can reduce the number of germs on your skin by washing with CHG (chlorhexidine gluconate) soap before surgery. CHG is an antiseptic soap that kills germs and continues to kill germs even after washing.   DO NOT use if you have an allergy to chlorhexidine/CHG or antibacterial soaps. If your skin becomes reddened or irritated, stop using the CHG and notify one of our RNs at [phone].  Please shower with the CHG soap starting 4 days before surgery using the following schedule:     Please keep in mind the following:  DO NOT shave, including legs and underarms, starting the day of your first shower.   You may shave your face at any point before/day of surgery.  Place clean sheets on your bed the day you start using CHG soap. Use a clean washcloth (not used since being washed) for each shower. DO NOT sleep with pets once you start using the CHG.   CHG Shower Instructions:  If you choose to wash your hair and private area, wash  first with your normal shampoo/soap.  After you use shampoo/soap, rinse your hair and body thoroughly to remove shampoo/soap residue.  Turn the water OFF and apply about 3 tablespoons (45 ml) of CHG soap to a CLEAN washcloth.  Apply CHG soap ONLY FROM YOUR NECK DOWN TO YOUR TOES (washing for 3-5 minutes)  DO NOT use CHG soap on face, private areas, open wounds, or sores.  Pay special attention to the area where your surgery is being performed.  If you are having back surgery, having someone wash your back for you may be helpful. Wait 2 minutes after CHG soap is applied, then you may rinse off the CHG soap.  Pat dry with a clean towel  Put on clean clothes/pajamas   If you choose to wear lotion, please use ONLY the CHG-compatible lotions on the back of this paper.   Additional instructions for the day of surgery: DO NOT APPLY any lotions, deodorants, cologne, or perfumes.   Do not bring valuables to the hospital. [facility] is not responsible for any belongings/valuables. Do not wear nail polish, gel polish, artificial nails, or any other type of covering on natural nails (fingers and toes) Do not wear jewelry or makeup Put on clean/comfortable clothes.  Please brush your teeth.  Ask your nurse before applying any prescription medications to the skin.     CHG Compatible Lotions   Aveeno Moisturizing lotion  Cetaphil Moisturizing Cream  Cetaphil Moisturizing Lotion  Clairol Herbal Essence Moisturizing Lotion, Dry Skin  Clairol Herbal Essence Moisturizing Lotion, Extra Dry Skin  Clairol Herbal Essence Moisturizing Lotion, Normal Skin  Curel Age Defying Therapeutic Moisturizing Lotion with Alpha Hydroxy  Curel Extreme Care Body Lotion  Curel Soothing Hands Moisturizing Hand Lotion  Curel Therapeutic Moisturizing Cream, Fragrance-Free  Curel Therapeutic Moisturizing Lotion, Fragrance-Free  Curel Therapeutic Moisturizing Lotion, Original Formula  Eucerin Daily Replenishing Lotion   Eucerin Dry Skin Therapy Plus Alpha Hydroxy Crme  Eucerin Dry Skin Therapy Plus Alpha Hydroxy Lotion  Eucerin Original Crme  Eucerin Original Lotion  Eucerin Plus Crme Eucerin Plus Lotion  Eucerin TriLipid Replenishing Lotion  Keri Anti-Bacterial Hand Lotion  Keri Deep Conditioning Original Lotion Dry Skin Formula Softly Scented  Keri Deep Conditioning Original Lotion, Fragrance Free Sensitive Skin Formula  Keri Lotion Fast Absorbing Fragrance Free Sensitive Skin Formula  Keri Lotion Fast Absorbing Softly Scented Dry Skin Formula  Keri Original Lotion  Keri Skin Renewal Lotion Keri Silky Smooth Lotion  Keri Silky Smooth Sensitive Skin Lotion  Nivea Body Creamy Conditioning Oil  Nivea Body Extra Enriched Teacher, adult education Moisturizing Lotion Nivea Crme  Nivea Skin Firming Lotion  NutraDerm 30 Skin Lotion  NutraDerm Skin Lotion  NutraDerm Therapeutic Skin Cream  NutraDerm Therapeutic Skin Lotion  ProShield Protective Hand Cream  Provon moisturizing lotion  Please read over the following  fact sheets that you were given.

## 2023-07-17 ENCOUNTER — Encounter (HOSPITAL_COMMUNITY)
Admission: RE | Admit: 2023-07-17 | Discharge: 2023-07-17 | Disposition: A | Discharge status: Home / Self Care | Payer: Medicare Other | Source: Ambulatory Visit | Attending: Neurological Surgery | Admitting: Neurological Surgery

## 2023-07-17 ENCOUNTER — Other Ambulatory Visit (HOSPITAL_COMMUNITY): Payer: Medicare Other

## 2023-07-17 ENCOUNTER — Other Ambulatory Visit: Payer: Self-pay

## 2023-07-17 ENCOUNTER — Encounter (HOSPITAL_COMMUNITY): Payer: Self-pay

## 2023-07-17 VITALS — BP 173/61 | HR 73 | Temp 98.4°F | Resp 18 | Ht 61.0 in | Wt 162.2 lb

## 2023-07-17 DIAGNOSIS — Z01812 Encounter for preprocedural laboratory examination: Secondary | ICD-10-CM | POA: Diagnosis not present

## 2023-07-17 DIAGNOSIS — Z01818 Encounter for other preprocedural examination: Secondary | ICD-10-CM | POA: Diagnosis present

## 2023-07-17 HISTORY — DX: Hypothyroidism, unspecified: E03.9

## 2023-07-17 LAB — CBC
HCT: 41.8 % (ref 36.0–46.0)
Hemoglobin: 13.4 g/dL (ref 12.0–15.0)
MCH: 30.2 pg (ref 26.0–34.0)
MCHC: 32.1 g/dL (ref 30.0–36.0)
MCV: 94.1 fL (ref 80.0–100.0)
Platelets: 231 10*3/uL (ref 150–400)
RBC: 4.44 MIL/uL (ref 3.87–5.11)
RDW: 14.1 % (ref 11.5–15.5)
WBC: 8.4 10*3/uL (ref 4.0–10.5)
nRBC: 0 % (ref 0.0–0.2)

## 2023-07-17 LAB — SURGICAL PCR SCREEN
MRSA, PCR: NEGATIVE
Staphylococcus aureus: NEGATIVE

## 2023-07-17 NOTE — Progress Notes (Signed)
PCP - Dr. Aram Beecham Cardiologist - Denies  PPM/ICD - Denies Device Orders - n/a Rep Notified - n/a  Chest x-ray - Denies EKG - Per pt, many years ago Stress Test - Per pt, many years ago - result normal ECHO - 11/13/2019 for Dyspnea - normal Cardiac Cath - Denies  Sleep Study - Denies CPAP - n/a  No DM  Last dose of GLP1 agonist- n/a GLP1 instructions: n/a  Blood Thinner Instructions: n/a Aspirin Instructions: Pt has already stopped her ASA in anticipation for sx. Last dose was August 2nd  NPO after midnight  COVID TEST- n/a   Anesthesia review: No.   Patient denies shortness of breath, fever, cough and chest pain at PAT appointment. Pt denies any respiratory illness/infection in the last two months.   All instructions explained to the patient, with a verbal understanding of the material. Patient agrees to go over the instructions while at home for a better understanding. Patient also instructed to self quarantine after being tested for COVID-19. The opportunity to ask questions was provided.

## 2023-07-25 ENCOUNTER — Encounter (HOSPITAL_COMMUNITY)
Admission: RE | Disposition: A | Discharge status: Home / Self Care | Payer: Self-pay | Source: Home / Self Care | Attending: Neurological Surgery

## 2023-07-25 ENCOUNTER — Observation Stay (HOSPITAL_COMMUNITY)
Admission: RE | Admit: 2023-07-25 | Discharge: 2023-07-26 | Disposition: A | Discharge status: Home / Self Care | Payer: Medicare Other | Attending: Neurological Surgery | Admitting: Neurological Surgery

## 2023-07-25 ENCOUNTER — Ambulatory Visit (HOSPITAL_COMMUNITY): Payer: Medicare Other

## 2023-07-25 ENCOUNTER — Ambulatory Visit (HOSPITAL_COMMUNITY): Payer: Medicare Other | Admitting: Anesthesiology

## 2023-07-25 ENCOUNTER — Ambulatory Visit (HOSPITAL_BASED_OUTPATIENT_CLINIC_OR_DEPARTMENT_OTHER): Payer: Medicare Other | Admitting: Anesthesiology

## 2023-07-25 ENCOUNTER — Other Ambulatory Visit: Payer: Self-pay

## 2023-07-25 ENCOUNTER — Encounter (HOSPITAL_COMMUNITY): Payer: Self-pay | Admitting: Neurological Surgery

## 2023-07-25 DIAGNOSIS — Z79899 Other long term (current) drug therapy: Secondary | ICD-10-CM | POA: Insufficient documentation

## 2023-07-25 DIAGNOSIS — Z853 Personal history of malignant neoplasm of breast: Secondary | ICD-10-CM | POA: Diagnosis not present

## 2023-07-25 DIAGNOSIS — E039 Hypothyroidism, unspecified: Secondary | ICD-10-CM | POA: Diagnosis not present

## 2023-07-25 DIAGNOSIS — Z7982 Long term (current) use of aspirin: Secondary | ICD-10-CM | POA: Insufficient documentation

## 2023-07-25 DIAGNOSIS — M48062 Spinal stenosis, lumbar region with neurogenic claudication: Secondary | ICD-10-CM | POA: Diagnosis present

## 2023-07-25 DIAGNOSIS — M5416 Radiculopathy, lumbar region: Secondary | ICD-10-CM | POA: Diagnosis not present

## 2023-07-25 DIAGNOSIS — Z85828 Personal history of other malignant neoplasm of skin: Secondary | ICD-10-CM | POA: Insufficient documentation

## 2023-07-25 HISTORY — PX: LUMBAR LAMINECTOMY/DECOMPRESSION MICRODISCECTOMY: SHX5026

## 2023-07-25 SURGERY — LUMBAR LAMINECTOMY/DECOMPRESSION MICRODISCECTOMY 1 LEVEL
Anesthesia: General | Site: Back | Laterality: Bilateral

## 2023-07-25 MED ORDER — CEFAZOLIN SODIUM-DEXTROSE 2-4 GM/100ML-% IV SOLN
2.0000 g | Freq: Three times a day (TID) | INTRAVENOUS | Status: AC
  Administered 2023-07-25 (×2): 2 g via INTRAVENOUS
  Filled 2023-07-25 (×2): qty 100

## 2023-07-25 MED ORDER — LIDOCAINE-EPINEPHRINE 1 %-1:100000 IJ SOLN
INTRAMUSCULAR | Status: DC | PRN
  Administered 2023-07-25: 10 mL

## 2023-07-25 MED ORDER — FENTANYL CITRATE (PF) 250 MCG/5ML IJ SOLN
INTRAMUSCULAR | Status: AC
  Filled 2023-07-25: qty 5

## 2023-07-25 MED ORDER — BUPIVACAINE HCL (PF) 0.5 % IJ SOLN
INTRAMUSCULAR | Status: AC
  Filled 2023-07-25: qty 30

## 2023-07-25 MED ORDER — LIDOCAINE 2% (20 MG/ML) 5 ML SYRINGE
INTRAMUSCULAR | Status: AC
  Filled 2023-07-25: qty 5

## 2023-07-25 MED ORDER — HYDROMORPHONE HCL 1 MG/ML IJ SOLN
0.5000 mg | INTRAMUSCULAR | Status: DC | PRN
  Administered 2023-07-25: 0.5 mg via INTRAVENOUS
  Filled 2023-07-25: qty 0.5

## 2023-07-25 MED ORDER — ONDANSETRON HCL 4 MG/2ML IJ SOLN: 4.0000 mg | Freq: Four times a day (QID) | INTRAMUSCULAR | Status: DC | PRN

## 2023-07-25 MED ORDER — FLUTICASONE PROPIONATE 50 MCG/ACT NA SUSP: 2.0000 | Freq: Every day | NASAL | Status: DC | PRN

## 2023-07-25 MED ORDER — EPHEDRINE 5 MG/ML INJ
INTRAVENOUS | Status: AC
  Filled 2023-07-25: qty 5

## 2023-07-25 MED ORDER — ONDANSETRON HCL 4 MG/2ML IJ SOLN
INTRAMUSCULAR | Status: AC
  Filled 2023-07-25: qty 2

## 2023-07-25 MED ORDER — ACETAMINOPHEN 325 MG PO TABS: 650.0000 mg | ORAL_TABLET | ORAL | Status: DC | PRN

## 2023-07-25 MED ORDER — DEXAMETHASONE SODIUM PHOSPHATE 10 MG/ML IJ SOLN
INTRAMUSCULAR | Status: AC
  Filled 2023-07-25: qty 1

## 2023-07-25 MED ORDER — LACTATED RINGERS IV SOLN: INTRAVENOUS | Status: DC

## 2023-07-25 MED ORDER — EPHEDRINE SULFATE-NACL 50-0.9 MG/10ML-% IV SOSY
PREFILLED_SYRINGE | INTRAVENOUS | Status: DC | PRN
  Administered 2023-07-25: 5 mg via INTRAVENOUS

## 2023-07-25 MED ORDER — LORATADINE 10 MG PO TABS: 10.0000 mg | ORAL_TABLET | Freq: Every day | ORAL | Status: DC

## 2023-07-25 MED ORDER — CHLORHEXIDINE GLUCONATE 0.12 % MT SOLN: 15.0000 mL | Freq: Once | OROMUCOSAL | Status: AC

## 2023-07-25 MED ORDER — ACETAMINOPHEN 10 MG/ML IV SOLN: 1000.0000 mg | Freq: Once | INTRAVENOUS | Status: DC | PRN

## 2023-07-25 MED ORDER — THROMBIN 5000 UNITS EX SOLR
OROMUCOSAL | Status: DC | PRN
  Administered 2023-07-25: 5 mL via TOPICAL

## 2023-07-25 MED ORDER — MENTHOL 3 MG MT LOZG: 1.0000 | LOZENGE | OROMUCOSAL | Status: DC | PRN

## 2023-07-25 MED ORDER — CEFAZOLIN SODIUM-DEXTROSE 2-4 GM/100ML-% IV SOLN
INTRAVENOUS | Status: AC
  Filled 2023-07-25: qty 100

## 2023-07-25 MED ORDER — PROPOFOL 10 MG/ML IV BOLUS
INTRAVENOUS | Status: DC | PRN
Start: 2023-07-25 — End: 2023-07-25
  Administered 2023-07-25: 70 mg via INTRAVENOUS

## 2023-07-25 MED ORDER — LEVOTHYROXINE SODIUM 25 MCG PO TABS
50.0000 ug | ORAL_TABLET | Freq: Every day | ORAL | Status: DC
  Administered 2023-07-26: 50 ug via ORAL
  Filled 2023-07-25: qty 2

## 2023-07-25 MED ORDER — SENNA 8.6 MG PO TABS
1.0000 | ORAL_TABLET | Freq: Two times a day (BID) | ORAL | Status: DC
  Administered 2023-07-25: 8.6 mg via ORAL
  Filled 2023-07-25: qty 1

## 2023-07-25 MED ORDER — THROMBIN 5000 UNITS EX SOLR
CUTANEOUS | Status: AC
  Filled 2023-07-25: qty 5000

## 2023-07-25 MED ORDER — ALBUMIN HUMAN 5 % IV SOLN
INTRAVENOUS | Status: DC | PRN
Start: 2023-07-25 — End: 2023-07-25

## 2023-07-25 MED ORDER — SUGAMMADEX SODIUM 200 MG/2ML IV SOLN
INTRAVENOUS | Status: DC | PRN
  Administered 2023-07-25: 200 mg via INTRAVENOUS

## 2023-07-25 MED ORDER — OXYCODONE-ACETAMINOPHEN 5-325 MG PO TABS
1.0000 | ORAL_TABLET | ORAL | Status: DC | PRN
  Administered 2023-07-25 – 2023-07-26 (×3): 1 via ORAL
  Filled 2023-07-25 (×3): qty 1

## 2023-07-25 MED ORDER — SODIUM CHLORIDE 0.9% FLUSH
3.0000 mL | Freq: Two times a day (BID) | INTRAVENOUS | Status: DC
  Administered 2023-07-25: 3 mL via INTRAVENOUS

## 2023-07-25 MED ORDER — ROCURONIUM BROMIDE 10 MG/ML (PF) SYRINGE
PREFILLED_SYRINGE | INTRAVENOUS | Status: AC
  Filled 2023-07-25: qty 10

## 2023-07-25 MED ORDER — FENTANYL CITRATE (PF) 250 MCG/5ML IJ SOLN
INTRAMUSCULAR | Status: DC | PRN
  Administered 2023-07-25: 100 ug via INTRAVENOUS

## 2023-07-25 MED ORDER — LIDOCAINE 2% (20 MG/ML) 5 ML SYRINGE
INTRAMUSCULAR | Status: DC | PRN
  Administered 2023-07-25: 80 mg via INTRAVENOUS

## 2023-07-25 MED ORDER — ORAL CARE MOUTH RINSE: 15.0000 mL | Freq: Once | OROMUCOSAL | Status: AC

## 2023-07-25 MED ORDER — POLYETHYL GLYCOL-PROPYL GLYCOL 0.4-0.3 % OP GEL: 1.0000 | Freq: Every day | OPHTHALMIC | Status: DC

## 2023-07-25 MED ORDER — DEXAMETHASONE SODIUM PHOSPHATE 10 MG/ML IJ SOLN
INTRAMUSCULAR | Status: DC | PRN
  Administered 2023-07-25: 10 mg via INTRAVENOUS
  Administered 2023-07-25 (×3): 2.5 mg via INTRAVENOUS

## 2023-07-25 MED ORDER — 0.9 % SODIUM CHLORIDE (POUR BTL) OPTIME
TOPICAL | Status: DC | PRN
  Administered 2023-07-25: 1000 mL

## 2023-07-25 MED ORDER — ONDANSETRON HCL 4 MG/2ML IJ SOLN
INTRAMUSCULAR | Status: DC | PRN
  Administered 2023-07-25: 4 mg via INTRAVENOUS

## 2023-07-25 MED ORDER — METHOCARBAMOL 1000 MG/10ML IJ SOLN: 500.0000 mg | Freq: Four times a day (QID) | INTRAVENOUS | Status: DC | PRN

## 2023-07-25 MED ORDER — OXYCODONE HCL 5 MG/5ML PO SOLN: 5.0000 mg | Freq: Once | ORAL | Status: AC | PRN

## 2023-07-25 MED ORDER — CHLORHEXIDINE GLUCONATE 0.12 % MT SOLN
OROMUCOSAL | Status: AC
  Administered 2023-07-25: 15 mL via OROMUCOSAL
  Filled 2023-07-25: qty 15

## 2023-07-25 MED ORDER — POLYETHYLENE GLYCOL 3350 17 G PO PACK: 17.0000 g | PACK | Freq: Every day | ORAL | Status: DC | PRN

## 2023-07-25 MED ORDER — OXYCODONE HCL 5 MG PO TABS
5.0000 mg | ORAL_TABLET | Freq: Once | ORAL | Status: AC | PRN
  Administered 2023-07-25: 5 mg via ORAL

## 2023-07-25 MED ORDER — PHENOL 1.4 % MT LIQD: 1.0000 | OROMUCOSAL | Status: DC | PRN

## 2023-07-25 MED ORDER — SODIUM CHLORIDE 0.9 % IV SOLN: 250.0000 mL | INTRAVENOUS | Status: DC

## 2023-07-25 MED ORDER — BUPIVACAINE HCL (PF) 0.5 % IJ SOLN
INTRAMUSCULAR | Status: DC | PRN
  Administered 2023-07-25: 25 mL

## 2023-07-25 MED ORDER — ROCURONIUM BROMIDE 10 MG/ML (PF) SYRINGE
PREFILLED_SYRINGE | INTRAVENOUS | Status: DC | PRN
  Administered 2023-07-25: 70 mg via INTRAVENOUS

## 2023-07-25 MED ORDER — POLYVINYL ALCOHOL 1.4 % OP SOLN
1.0000 [drp] | Freq: Two times a day (BID) | OPHTHALMIC | Status: DC
  Filled 2023-07-25: qty 15

## 2023-07-25 MED ORDER — LIDOCAINE-EPINEPHRINE 1 %-1:100000 IJ SOLN
INTRAMUSCULAR | Status: AC
  Filled 2023-07-25: qty 1

## 2023-07-25 MED ORDER — FENTANYL CITRATE (PF) 100 MCG/2ML IJ SOLN
INTRAMUSCULAR | Status: AC
  Filled 2023-07-25: qty 2

## 2023-07-25 MED ORDER — CHLORHEXIDINE GLUCONATE CLOTH 2 % EX PADS: 6.0000 | MEDICATED_PAD | Freq: Once | CUTANEOUS | Status: DC

## 2023-07-25 MED ORDER — PROPOFOL 10 MG/ML IV BOLUS
INTRAVENOUS | Status: AC
  Filled 2023-07-25: qty 20

## 2023-07-25 MED ORDER — PREGABALIN 25 MG PO CAPS
50.0000 mg | ORAL_CAPSULE | Freq: Two times a day (BID) | ORAL | Status: DC
  Administered 2023-07-25: 50 mg via ORAL
  Filled 2023-07-25: qty 2

## 2023-07-25 MED ORDER — PANTOPRAZOLE SODIUM 40 MG PO TBEC: 40.0000 mg | DELAYED_RELEASE_TABLET | Freq: Every day | ORAL | Status: DC | PRN

## 2023-07-25 MED ORDER — OXYCODONE HCL 5 MG PO TABS
ORAL_TABLET | ORAL | Status: AC
  Filled 2023-07-25: qty 1

## 2023-07-25 MED ORDER — METHOCARBAMOL 500 MG PO TABS
500.0000 mg | ORAL_TABLET | Freq: Four times a day (QID) | ORAL | Status: DC | PRN
  Filled 2023-07-25 (×2): qty 1

## 2023-07-25 MED ORDER — FLEET ENEMA RE ENEM: 1.0000 | ENEMA | Freq: Once | RECTAL | Status: DC | PRN

## 2023-07-25 MED ORDER — FENTANYL CITRATE (PF) 100 MCG/2ML IJ SOLN
25.0000 ug | INTRAMUSCULAR | Status: DC | PRN
  Administered 2023-07-25 (×2): 25 ug via INTRAVENOUS

## 2023-07-25 MED ORDER — DOCUSATE SODIUM 100 MG PO CAPS
100.0000 mg | ORAL_CAPSULE | Freq: Two times a day (BID) | ORAL | Status: DC
  Administered 2023-07-25: 100 mg via ORAL
  Filled 2023-07-25: qty 1

## 2023-07-25 MED ORDER — ACETAMINOPHEN 650 MG RE SUPP: 650.0000 mg | RECTAL | Status: DC | PRN

## 2023-07-25 MED ORDER — SODIUM CHLORIDE 0.9% FLUSH: 3.0000 mL | INTRAVENOUS | Status: DC | PRN

## 2023-07-25 MED ORDER — BISACODYL 10 MG RE SUPP: 10.0000 mg | Freq: Every day | RECTAL | Status: DC | PRN

## 2023-07-25 MED ORDER — ONDANSETRON HCL 4 MG PO TABS: 4.0000 mg | ORAL_TABLET | Freq: Four times a day (QID) | ORAL | Status: DC | PRN

## 2023-07-25 MED ORDER — CEFAZOLIN SODIUM-DEXTROSE 2-4 GM/100ML-% IV SOLN
2.0000 g | INTRAVENOUS | Status: AC
  Administered 2023-07-25: 2 g via INTRAVENOUS

## 2023-07-25 MED ORDER — ONDANSETRON HCL 4 MG/2ML IJ SOLN: 4.0000 mg | Freq: Once | INTRAMUSCULAR | Status: DC | PRN

## 2023-07-25 SURGICAL SUPPLY — 48 items
ADH SKN CLS APL DERMABOND .7 (GAUZE/BANDAGES/DRESSINGS) ×1
BAG COUNTER SPONGE SURGICOUNT (BAG) ×1 IMPLANT
BAG SPNG CNTER NS LX DISP (BAG) ×1
BLADE CLIPPER SURG (BLADE) IMPLANT
BUR ACORN 6.0 (BURR) IMPLANT
BUR MATCHSTICK NEURO 3.0 LAGG (BURR) ×1 IMPLANT
CANISTER SUCT 3000ML PPV (MISCELLANEOUS) ×1 IMPLANT
DERMABOND ADVANCED .7 DNX12 (GAUZE/BANDAGES/DRESSINGS) ×1 IMPLANT
DEVICE DISSECT PLASMABLAD 3.0S (MISCELLANEOUS) ×1 IMPLANT
DRAPE HALF SHEET 40X57 (DRAPES) IMPLANT
DRAPE LAPAROTOMY T 102X78X121 (DRAPES) ×1 IMPLANT
DRAPE MICROSCOPE SLANT 54X150 (MISCELLANEOUS) IMPLANT
DRSG OPSITE POSTOP 4X6 (GAUZE/BANDAGES/DRESSINGS) IMPLANT
DURAPREP 26ML APPLICATOR (WOUND CARE) ×1 IMPLANT
ELECT REM PT RETURN 9FT ADLT (ELECTROSURGICAL) ×1
ELECTRODE REM PT RTRN 9FT ADLT (ELECTROSURGICAL) ×1 IMPLANT
GAUZE 4X4 16PLY ~~LOC~~+RFID DBL (SPONGE) IMPLANT
GAUZE SPONGE 4X4 12PLY STRL (GAUZE/BANDAGES/DRESSINGS) ×1 IMPLANT
GLOVE BIOGEL PI IND STRL 8.5 (GLOVE) ×1 IMPLANT
GLOVE ECLIPSE 8.5 STRL (GLOVE) ×1 IMPLANT
GOWN STRL REUS W/ TWL LRG LVL3 (GOWN DISPOSABLE) IMPLANT
GOWN STRL REUS W/ TWL XL LVL3 (GOWN DISPOSABLE) IMPLANT
GOWN STRL REUS W/TWL 2XL LVL3 (GOWN DISPOSABLE) ×1 IMPLANT
GOWN STRL REUS W/TWL LRG LVL3 (GOWN DISPOSABLE)
GOWN STRL REUS W/TWL XL LVL3 (GOWN DISPOSABLE)
HEMOSTAT POWDER KIT SURGIFOAM (HEMOSTASIS) ×1 IMPLANT
KIT BASIN OR (CUSTOM PROCEDURE TRAY) ×1 IMPLANT
KIT TURNOVER KIT B (KITS) ×1 IMPLANT
NDL HYPO 22X1.5 SAFETY MO (MISCELLANEOUS) ×1 IMPLANT
NDL SPNL 20GX3.5 QUINCKE YW (NEEDLE) IMPLANT
NEEDLE HYPO 22X1.5 SAFETY MO (MISCELLANEOUS) ×1 IMPLANT
NEEDLE SPNL 20GX3.5 QUINCKE YW (NEEDLE) ×1 IMPLANT
NS IRRIG 1000ML POUR BTL (IV SOLUTION) ×1 IMPLANT
PACK LAMINECTOMY NEURO (CUSTOM PROCEDURE TRAY) ×1 IMPLANT
PAD ARMBOARD 7.5X6 YLW CONV (MISCELLANEOUS) ×3 IMPLANT
PATTIES SURGICAL .5 X1 (DISPOSABLE) ×1 IMPLANT
PLASMABLADE 3.0S (MISCELLANEOUS) ×1
SPIKE FLUID TRANSFER (MISCELLANEOUS) ×1 IMPLANT
SPONGE SURGIFOAM ABS GEL SZ50 (HEMOSTASIS) IMPLANT
SUT VIC AB 1 CT1 18XBRD ANBCTR (SUTURE) ×1 IMPLANT
SUT VIC AB 1 CT1 8-18 (SUTURE) ×1
SUT VIC AB 2-0 CP2 18 (SUTURE) ×1 IMPLANT
SUT VIC AB 3-0 SH 8-18 (SUTURE) ×1 IMPLANT
SUT VIC AB 4-0 RB1 18 (SUTURE) ×1 IMPLANT
TOWEL GREEN STERILE (TOWEL DISPOSABLE) ×1 IMPLANT
TOWEL GREEN STERILE FF (TOWEL DISPOSABLE) ×1 IMPLANT
TUBING FEATHERFLOW (TUBING) ×1 IMPLANT
WATER STERILE IRR 1000ML POUR (IV SOLUTION) ×1 IMPLANT

## 2023-07-25 NOTE — Transfer of Care (Signed)
Immediate Anesthesia Transfer of Care Note  Patient: Katherine Gates  Procedure(s) Performed: SUBLAMINAR DECOMPRESSION Lumbar Two-Three (Bilateral: Back)  Patient Location: PACU  Anesthesia Type:General  Level of Consciousness: awake, alert , oriented, patient cooperative, and responds to stimulation  Airway & Oxygen Therapy: Patient Spontanous Breathing and Patient connected to face mask oxygen  Post-op Assessment: Report given to RN, Post -op Vital signs reviewed and stable, and Patient moving all extremities X 4  Post vital signs: Reviewed and stable  Last Vitals:  Vitals Value Taken Time  BP 167/87 07/25/23 0938  Temp    Pulse 79 07/25/23 0944  Resp 17 07/25/23 0944  SpO2 88 % 07/25/23 0944  Vitals shown include unfiled device data.  Last Pain:  Vitals:   07/25/23 0558  PainSc: 0-No pain         Complications: No notable events documented.

## 2023-07-25 NOTE — Op Note (Signed)
Date of surgery: 07/25/2023 Preoperative diagnosis: Lumbar spinal stenosis L2-L3.  History of fusion L3-L5.  Neurogenic claudication, lumbar radiculopathy. Postoperative diagnosis: Same Procedure: Sublaminar decompression L2-L3 via right-sided approach for laminectomy L2-L3. Surgeon: Barnett Abu Anesthesia: General Endotracheal Indications: Katherine Gates is an 89 year old individual who 5 years ago underwent surgical decompression for spondylolisthesis and high-grade stenosis at L3-4 and L4-5.  She tolerated the surgery quite well and was able to ambulate but here in the last 6 months she has developed progressive worsening and pain in difficulties with walking she has evidence of a significant and severe stenosis at the level of L2-L3 above her previous decompression.  She has been advised regarding surgical decompression at L2-L3 without arthrodesis.  Procedure: Patient was brought to the operating room supine on the stretcher.  After the smooth induction of general tracheal anesthesia she was carefully turned prone.  The back was prepped with alcohol DuraPrep and draped in a sterile fashion.  The superior portion of the incision was marked and after infiltrating with local anesthetic it was opened extension of the incision was carried cephalad.  Dissection was carried down to the lumbodorsal fascia and the radiograph was obtained with the needle in place to mark the L2-3 interspace which was defined accurately.  Then by doing a subperiosteal dissection the interlaminar space at L2-3 was identified and isolated.  The superior most L3 pedicle screws also identified.  By working medially and the interlaminar space a laminotomy was created removing the inferior margin lamina of L2 out to the medial wall of the facet.  Thickened redundant yellow ligament was taken up in this area and the common dural tube was exposed.  Care was taken to do an adequate decompression inferiorly under the superior arch of L3 and  superiorly under the arch of L2.  Ligament was noted to be markedly thickened and redundant in this area.  Then by carefully dissecting laterally the foramen for the L2 nerve root superiorly and the L3 nerve root inferiorly were cleared.  By tilting the table away were then able to do a sublaminar decompression on the contralateral side exposing to the foramen of L3 inferiorly and the foramen of L2 superiorly removing thickened redundant yellow ligament using a combination of 2 mm punches 2 oh and 3 oh curettes.  In the end it was felt that the common dural tube and the lateral recesses were well decompressed in this area with this hemostasis was carefully checked lumbar space was irrigated copiously 20 cc of half percent Marcaine was injected into the paraspinous fascia in this area and then the lumbodorsal fascia was closed with #1 Vicryl in interrupted fashion 2-0 Vicryl in the subcutaneous tissues 3-0 Vicryl subcuticularly along with some 4-0 Vicryl for final subcuticular closure Dermabond was placed on the skin and blood loss was estimated less than 50 cc.

## 2023-07-25 NOTE — H&P (Signed)
Katherine Gates is an 89 y.o. female.   Chief Complaint: Back pain bilateral leg weakness difficulty with gait HPI: Patient is an 89 year old individual whose had significant lumbar stenosis at the level of L2-L3 in addition to other degenerative changes in her lower lumbar spine.  She is described progressive difficulty with neurogenic claudication symptoms and she has been advised regarding the need for surgical decompression.  Past Medical History:  Diagnosis Date   Bell's palsy    Breast cancer (HCC) 2017   right   Dermatitis contact, eyelid    Environmental allergies    Fibrocystic breast disease    being followed at [facility]     GERD (gastroesophageal reflux disease)    HOH (hard of hearing)    Hypothyroidism    IBS (irritable bowel syndrome)    Melanoma of skin (HCC) 2011   left upper arm   Neuropathy    JUST IN LEGS   Osteoarthritis    Personal history of radiation therapy 2017   right breast ca   Rosacea     Past Surgical History:  Procedure Laterality Date   ABDOMINAL HYSTERECTOMY     BACK SURGERY  01/12/2019   L3,4,5 with titanium screws   BREAST BIOPSY Right 02/29/2016   mucinous ca   BREAST LUMPECTOMY Right 03/16/2016   mucinous ca clear margins and negative LN   ENDOSCOPIC CARPAL TUNNEL RELEASE Bilateral    EYE SURGERY     bilateral cataract   eyelid lift     LUMBAR FUSION  01/12/2019   PARTIAL MASTECTOMY WITH NEEDLE LOCALIZATION Right 03/16/2016   Procedure: PARTIAL MASTECTOMY;  Surgeon: Nadeen Landau, MD;  Location: [facility] ORS;  Service: General;  Laterality: Right;   repair tear ducts     SENTINEL NODE BIOPSY Right 03/16/2016   Procedure: SENTINEL LYMPH NODE BIOPSY;  Surgeon: Nadeen Landau, MD;  Location: [facility] ORS;  Service: General;  Laterality: Right;   SHOULDER ARTHROSCOPY WITH ROTATOR CUFF REPAIR Right 07/15/2019   Procedure: SHOULDER ARTHROSCOPY WITH ROTATOR CUFF REPAIR;  Surgeon: Lyndle Herrlich, MD;  Location: [facility] ORS;  Service: Orthopedics;   Laterality: Right;    Family History  Problem Relation Age of Onset   Skin cancer Brother        non-melanoma   Skin cancer Father        non-melanoma   Throat cancer Father    Stroke Mother    Breast cancer Neg Hx    Social History:  reports that she has never smoked. She has never used smokeless tobacco. She reports that she does not drink alcohol and does not use drugs.  Allergies:  Allergies  Allergen Reactions   Benzalkonium Chloride Other (See Comments)    Positive patch test   Codeine Nausea And Vomiting   Garlic Diarrhea   Methylisothiazolinone Other (See Comments)    Positive patch test     Medications Prior to Admission  Medication Sig Dispense Refill   acetaminophen (TYLENOL) 650 MG CR tablet Take 650-1,300 mg by mouth 3 (three) times daily as needed for pain.     fexofenadine (ALLEGRA) 180 MG tablet Take 180 mg by mouth in the morning.     fluticasone (FLONASE) 50 MCG/ACT nasal spray Place 2 sprays into both nostrils daily as needed for allergies.     levothyroxine (SYNTHROID) 50 MCG tablet Take 50 mcg by mouth daily before breakfast.     meloxicam (MOBIC) 7.5 MG tablet Take 7.5 mg by mouth in the morning.  metroNIDAZOLE (METROGEL) 0.75 % gel Apply 1 Application topically daily.     pantoprazole (PROTONIX) 40 MG tablet Take 40 mg by mouth daily as needed (indigestion/heartburn).     Polyethyl Glycol-Propyl Glycol (SYSTANE ULTRA PF OP) Place 1 drop into both eyes 2 (two) times daily as needed (dry/irritated eyes).     Polyethyl Glycol-Propyl Glycol (SYSTANE) 0.4-0.3 % GEL ophthalmic gel Place 1 Application into both eyes at bedtime.     pregabalin (LYRICA) 50 MG capsule Take 50 mg by mouth 2 (two) times daily.     aspirin EC 81 MG tablet Take 81 mg by mouth daily.       No results found for this or any previous visit (from the past 48 hour(s)). No results found.  Review of Systems  Constitutional:  Positive for activity change.  Musculoskeletal:  Positive  for back pain and gait problem.  All other systems reviewed and are negative.   Blood pressure (!) 181/62, pulse 65, temperature 98.2 F (36.8 C), resp. rate 18, height 5\' 1"  (1.549 m), weight 70.3 kg, SpO2 96%. Physical Exam Constitutional:      Appearance: Normal appearance.  HENT:     Head: Normocephalic and atraumatic.     Right Ear: Tympanic membrane, ear canal and external ear normal.     Left Ear: Tympanic membrane, ear canal and external ear normal.     Nose: Nose normal.     Mouth/Throat:     Mouth: Mucous membranes are moist.     Pharynx: Oropharynx is clear.  Eyes:     Extraocular Movements: Extraocular movements intact.     Conjunctiva/sclera: Conjunctivae normal.     Pupils: Pupils are equal, round, and reactive to light.  Cardiovascular:     Rate and Rhythm: Normal rate and regular rhythm.     Pulses: Normal pulses.     Heart sounds: Normal heart sounds.  Pulmonary:     Effort: Pulmonary effort is normal.     Breath sounds: Normal breath sounds.  Abdominal:     General: Abdomen is flat.     Palpations: Abdomen is soft.  Musculoskeletal:     Cervical back: Normal range of motion and neck supple.     Comments: Positive straight leg raising at 30 degrees in either lower extremity.  Patrick's maneuver is negative.  Skin:    General: Skin is warm and dry.     Capillary Refill: Capillary refill takes less than 2 seconds.  Neurological:     Mental Status: She is alert.     Comments: Third and oriented cranial nerve examination is normal.  Upper extremity strength is good tone and bulk in major muscle groups with preserved reflexes of 2+ in the biceps 1+ in the triceps 1+ in the brachioradialis.  Lower extremity reflexes are absent in the Achilles 1+ in both patellae.  Tone and bulk of the major groups of the lower extremities is intact however strength is weakened to 4 out of 5 in iliopsoas to quadricep tibialis anterior and gastrocs.  Sensation is intact to pin and  light touch in the distal lower extremities.  Psychiatric:        Mood and Affect: Mood normal.        Behavior: Behavior normal.        Thought Content: Thought content normal.        Judgment: Judgment normal.      Assessment/Plan Lumbar spinal stenosis L2-L3 with neurogenic claudication.  Plan: Sublaminar decompression L2-L3.  Stefani Dama, MD 07/25/2023, 7:42 AM

## 2023-07-25 NOTE — Progress Notes (Signed)
BP is 181/62. Dr. Ace Gins is aware.

## 2023-07-25 NOTE — Anesthesia Postprocedure Evaluation (Signed)
Anesthesia Post Note  Patient: Katherine Gates  Procedure(s) Performed: SUBLAMINAR DECOMPRESSION Lumbar Two-Three (Bilateral: Back)     Patient location during evaluation: PACU Anesthesia Type: General Level of consciousness: awake and alert Pain management: pain level controlled Vital Signs Assessment: post-procedure vital signs reviewed and stable Respiratory status: spontaneous breathing, nonlabored ventilation, respiratory function stable and patient connected to nasal cannula oxygen Cardiovascular status: blood pressure returned to baseline and stable Postop Assessment: no apparent nausea or vomiting Anesthetic complications: no   No notable events documented.  Last Vitals:  Vitals:   07/25/23 0938 07/25/23 0945  BP: (!) 167/87 (!) 165/66  Pulse: 83 83  Resp: 16 16  Temp: 36.6 C   SpO2: 92% 93%    Last Pain:  Vitals:   07/25/23 0558  PainSc: 0-No pain                 Mariann Barter

## 2023-07-25 NOTE — Anesthesia Preprocedure Evaluation (Signed)
Anesthesia Evaluation  Patient identified by MRN, date of birth, ID band Patient awake    Reviewed: Allergy & Precautions, NPO status , Patient's Chart, lab work & pertinent test results, reviewed documented beta blocker date and time   History of Anesthesia Complications Negative for: history of anesthetic complications  Airway Mallampati: II  TM Distance: >3 FB Neck ROM: Full    Dental no notable dental hx.    Pulmonary neg pneumonia , neg COPD   breath sounds clear to auscultation       Cardiovascular (-) hypertension(-) CAD, (-) Past MI, (-) Cardiac Stents and (-) CABG (-) dysrhythmias (-) Valvular Problems/Murmurs Rhythm:Regular Rate:Normal     Neuro/Psych neg Seizures meningioma  Neuromuscular disease    GI/Hepatic ,GERD  ,,  Endo/Other  Hypothyroidism    Renal/GU      Musculoskeletal  (+) Arthritis ,    Abdominal   Peds  Hematology   Anesthesia Other Findings   Reproductive/Obstetrics                              Anesthesia Physical Anesthesia Plan  ASA: 2  Anesthesia Plan: General   Post-op Pain Management:    Induction: Intravenous  PONV Risk Score and Plan: 1 and Ondansetron and Dexamethasone  Airway Management Planned: Oral ETT  Additional Equipment:   Intra-op Plan:   Post-operative Plan: Extubation in OR  Informed Consent: I have reviewed the patients History and Physical, chart, labs and discussed the procedure including the risks, benefits and alternatives for the proposed anesthesia with the patient or authorized representative who has indicated his/her understanding and acceptance.     Dental advisory given  Plan Discussed with: CRNA  Anesthesia Plan Comments:          Anesthesia Quick Evaluation

## 2023-07-25 NOTE — Anesthesia Procedure Notes (Signed)
Procedure Name: Intubation Date/Time: 07/25/2023 8:09 AM  Performed by: Shary Decamp, CRNAPre-anesthesia Checklist: Patient identified, Patient being monitored, Timeout performed, Emergency Drugs available and Suction available Patient Re-evaluated:Patient Re-evaluated prior to induction Oxygen Delivery Method: Circle System Utilized Preoxygenation: Pre-oxygenation with 100% oxygen Induction Type: IV induction Ventilation: Mask ventilation without difficulty Laryngoscope Size: Miller and 2 Grade View: Grade I Tube type: Oral Tube size: 7.0 mm Number of attempts: 1 Airway Equipment and Method: Stylet Placement Confirmation: ETT inserted through vocal cords under direct vision, positive ETCO2 and breath sounds checked- equal and bilateral Secured at: 21 cm Tube secured with: Tape Dental Injury: Teeth and Oropharynx as per pre-operative assessment

## 2023-07-26 ENCOUNTER — Encounter (HOSPITAL_COMMUNITY): Payer: Self-pay | Admitting: Neurological Surgery

## 2023-07-26 DIAGNOSIS — M48062 Spinal stenosis, lumbar region with neurogenic claudication: Secondary | ICD-10-CM | POA: Diagnosis not present

## 2023-07-26 LAB — CBC
HCT: 36.2 % (ref 36.0–46.0)
Hemoglobin: 12 g/dL (ref 12.0–15.0)
MCH: 30.8 pg (ref 26.0–34.0)
MCHC: 33.1 g/dL (ref 30.0–36.0)
MCV: 93.1 fL (ref 80.0–100.0)
Platelets: 215 10*3/uL (ref 150–400)
RBC: 3.89 MIL/uL (ref 3.87–5.11)
RDW: 14 % (ref 11.5–15.5)
WBC: 16.4 10*3/uL — ABNORMAL HIGH (ref 4.0–10.5)
nRBC: 0 % (ref 0.0–0.2)

## 2023-07-26 LAB — BASIC METABOLIC PANEL
Anion gap: 10 (ref 5–15)
BUN: 23 mg/dL (ref 8–23)
CO2: 26 mmol/L (ref 22–32)
Calcium: 9.4 mg/dL (ref 8.9–10.3)
Chloride: 101 mmol/L (ref 98–111)
Creatinine, Ser: 1.07 mg/dL — ABNORMAL HIGH (ref 0.44–1.00)
GFR, Estimated: 50 mL/min — ABNORMAL LOW (ref >60)
Glucose, Bld: 133 mg/dL — ABNORMAL HIGH (ref 70–99)
Potassium: 4.7 mmol/L (ref 3.5–5.1)
Sodium: 137 mmol/L (ref 135–145)

## 2023-07-26 MED ORDER — OXYCODONE-ACETAMINOPHEN 5-325 MG PO TABS: 1.0000 | ORAL_TABLET | ORAL | 0 refills | Status: AC | PRN

## 2023-07-26 MED ORDER — DEXAMETHASONE 1 MG PO TABS: ORAL_TABLET | ORAL | 0 refills | Status: AC

## 2023-07-26 MED ORDER — METHOCARBAMOL 500 MG PO TABS: 500.0000 mg | ORAL_TABLET | Freq: Four times a day (QID) | ORAL | 3 refills | Status: AC | PRN

## 2023-07-26 NOTE — Progress Notes (Signed)
PT Cancellation Note  Patient Details Name: AMORINA EDSALL MRN: [id number] DOB: 1933/02/12   Cancelled Treatment:    Reason Eval/Treat Not Completed: PT screened, no needs identified, will sign off. Per discussion with OT the pt is mobilizing well and does not require PT evaluation at this time, preparing to discharge home. PT signing off.   Arlyss Gandy 07/26/2023, 9:08 AM

## 2023-07-26 NOTE — Evaluation (Signed)
Occupational Therapy Evaluation Patient Details Name: Katherine Gates MRN: [id number] DOB: November 04, 1933 Today's Date: 07/26/2023   History of Present Illness 89 yo female s/p 8/15 L2-3 sublaminar decompression R side laminectomy PMH breast CA, bells palsy HOH IBS neuropathy OA rosacea back surg breast lumpectomy R RCR   Clinical Impression   Patient evaluated by Occupational Therapy with no further acute OT needs identified. All education has been completed and the patient has no further questions. See below for any follow-up Occupational Therapy or equipment needs. OT to sign off. Thank you for referral.         If plan is discharge home, recommend the following: A Creasy help with walking and/or transfers;Assist for transportation    Functional Status Assessment  Patient has had a recent decline in their functional status and demonstrates the ability to make significant improvements in function in a reasonable and predictable amount of time.  Equipment Recommendations  Other (comment) (RW)    Recommendations for Other Services       Precautions / Restrictions Precautions Precautions: Back Precaution Comments: back handout provided and reviewed with patient      Mobility Bed Mobility Overal bed mobility: Modified Independent             General bed mobility comments: educated on sequence for log rolling    Transfers Overall transfer level: Modified independent Equipment used: Rolling walker (2 wheels)               General transfer comment: pt given RW due to holding hand rail in the hall. pt reports i do feel Houchins dizzy today and i think i am more unsteady than i thought.      Balance Overall balance assessment: Mild deficits observed, not formally tested                                         ADL either performed or assessed with clinical judgement   ADL Overall ADL's : Modified independent                                        General ADL Comments: pt was able to dress for home with figure 4 cross. educated on reacher and toilet aide and plans to purchase. Handout reviewed. Pt likes to cook and given techniques to help with back precautions. Back handout provided and reviewed adls in detail. Pt educated on:set an alarm at night for medication, avoid sitting for long periods of time, correct bed positioning for sleeping, correct sequence for bed mobility, avoiding lifting more than 5 pounds and never wash directly over incision. All education is complete and patient indicates understanding.      Vision Baseline Vision/History: 1 Wears glasses Ability to See in Adequate Light: 0 Adequate Patient Visual Report: No change from baseline Vision Assessment?: No apparent visual deficits     Perception         Praxis         Pertinent Vitals/Pain Pain Assessment Pain Assessment: No/denies pain     Extremity/Trunk Assessment Upper Extremity Assessment Upper Extremity Assessment: Overall WFL for tasks assessed   Lower Extremity Assessment Lower Extremity Assessment: Generalized weakness   Cervical / Trunk Assessment Cervical / Trunk Assessment: Back Surgery   Communication Communication Communication: Other (comment) (HOH with  hearing aides but the battery is dead)   Cognition Arousal: Alert Behavior During Therapy: WFL for tasks assessed/performed Overall Cognitive Status: Within Functional Limits for tasks assessed                                       General Comments  incision dry and intact    Exercises     Shoulder Instructions      Home Living Family/patient expects to be discharged to:: Private residence Living Arrangements: Spouse/significant other Available Help at Discharge: Family;Available 24 hours/day Type of Home: House Home Access: Stairs to enter [employer] of Steps: 3 Entrance Stairs-Rails: Left Home Layout: One level     Bathroom  Shower/Tub: Door   [employer]: Handicapped height Bathroom Accessibility: Yes How Accessible: Accessible via walker Home Equipment: Grab bars - toilet          Prior Functioning/Environment Prior Level of Function : Independent/Modified Independent;Driving             Mobility Comments: reports prior use of RW- pt reports " i used this to go to the doctor and i was asked why i was on this thing. it embarrassed me so i want to be off it so that doesnt happen again" ADLs Comments: loves to cook, has a Programmer, applications and someone mow the yard        OT Problem List:        OT Treatment/Interventions:      OT Goals(Current goals can be found in the care plan section) Acute Rehab OT Goals Patient Stated Goal: to go home  OT Frequency:      Co-evaluation              AM-PAC OT "6 Clicks" Daily Activity     Outcome Measure Help from another person eating meals?: None Help from another person taking care of personal grooming?: None Help from another person toileting, which includes using toliet, bedpan, or urinal?: None Help from another person bathing (including washing, rinsing, drying)?: None Help from another person to put on and taking off regular upper body clothing?: None Help from another person to put on and taking off regular lower body clothing?: None 6 Click Score: 24   End of Session Equipment Utilized During Treatment: Gait belt;Rolling walker (2 wheels) Nurse Communication: Mobility status;Precautions  Activity Tolerance: Patient tolerated treatment well Patient left: in chair;with call bell/phone within reach;with family/visitor present  OT Visit Diagnosis: Unsteadiness on feet (R26.81)                Time: 8295-6213 OT Time Calculation (min): 41 min Charges:  OT General Charges $OT Visit: 1 Visit OT Evaluation $OT Eval Moderate Complexity: 1 Mod OT Treatments $Self Care/Home Management : 8-22 mins   Brynn, OTR/L  Acute Rehabilitation  Services Office: [phone] .   Mateo Flow 07/26/2023, 9:55 AM

## 2023-07-26 NOTE — Discharge Summary (Signed)
Physician Discharge Summary  Patient ID: Katherine Gates MRN: [id number] DOB/AGE: 03/17/1933 89 y.o.  Admit date: 07/25/2023 Discharge date: 07/26/2023  Admission Diagnoses: Lumbar stenosis L2-L3.  History of fusion L3-L5.  Lumbar radiculopathy, neurogenic claudication.  Discharge Diagnoses: Lumbar stenosis L2-L3.  Neurogenic claudication.  Lumbar radiculopathy.  History of fusion L3-L5. Principal Problem:   Lumbar stenosis with neurogenic claudication   Discharged Condition: good  Hospital Course: Patient was admitted to undergo surgical decompression at L2-L3 which she tolerated well.  Consults: None  Significant Diagnostic Studies: None  Treatments: surgery: See op note  Discharge Exam: Blood pressure (!) 126/50, pulse 72, temperature 98.2 F (36.8 C), temperature source Oral, resp. rate 20, height 5\' 1"  (1.549 m), weight 70.3 kg, SpO2 91%. Incision is clean and dry Station and gait are intact.  Disposition: Discharge disposition: 01-Home or Self Care       Discharge Instructions     Call MD for:  redness, tenderness, or signs of infection (pain, swelling, redness, odor or green/yellow discharge around incision site)   Complete by: As directed    Call MD for:  severe uncontrolled pain   Complete by: As directed    Call MD for:  temperature >100.4   Complete by: As directed    Diet - low sodium heart healthy   Complete by: As directed    Discharge instructions   Complete by: As directed    Okay to shower. Do not apply salves or appointments to incision. No heavy lifting with the upper extremities greater than 10 pounds. May resume driving when not requiring pain medication and patient feels comfortable with doing so.   Incentive spirometry RT   Complete by: As directed    Increase activity slowly   Complete by: As directed       Allergies as of 07/26/2023       Reactions   Benzalkonium Chloride Other (See Comments)   Positive patch test   Codeine Nausea And  Vomiting   Garlic Diarrhea   Methylisothiazolinone Other (See Comments)   Positive patch test        Medication List     TAKE these medications    acetaminophen 650 MG CR tablet Commonly known as: TYLENOL Take 650-1,300 mg by mouth 3 (three) times daily as needed for pain.   aspirin EC 81 MG tablet Take 81 mg by mouth daily.   dexamethasone 1 MG tablet Commonly known as: DECADRON 2 tablets twice daily for 2 days, one tablet twice daily for 2 days, one tablet daily for 2 days.   fexofenadine 180 MG tablet Commonly known as: ALLEGRA Take 180 mg by mouth in the morning.   fluticasone 50 MCG/ACT nasal spray Commonly known as: FLONASE Place 2 sprays into both nostrils daily as needed for allergies.   levothyroxine 50 MCG tablet Commonly known as: SYNTHROID Take 50 mcg by mouth daily before breakfast.   meloxicam 7.5 MG tablet Commonly known as: MOBIC Take 7.5 mg by mouth in the morning.   methocarbamol 500 MG tablet Commonly known as: ROBAXIN Take 1 tablet (500 mg total) by mouth every 6 (six) hours as needed for muscle spasms.   metroNIDAZOLE 0.75 % gel Commonly known as: METROGEL Apply 1 Application topically daily.   oxyCODONE-acetaminophen 5-325 MG tablet Commonly known as: PERCOCET/ROXICET Take 1 tablet by mouth every 4 (four) hours as needed for moderate pain or severe pain.   pantoprazole 40 MG tablet Commonly known as: PROTONIX Take 40 mg by mouth  daily as needed (indigestion/heartburn).   pregabalin 50 MG capsule Commonly known as: LYRICA Take 50 mg by mouth 2 (two) times daily.   SYSTANE ULTRA PF OP Place 1 drop into both eyes 2 (two) times daily as needed (dry/irritated eyes).   Systane 0.4-0.3 % Gel ophthalmic gel Generic drug: Polyethyl Glycol-Propyl Glycol Place 1 Application into both eyes at bedtime.         Signed: Stefani Dama 07/26/2023, 8:18 AM

## 2023-07-26 NOTE — Plan of Care (Signed)

## 2023-11-01 ENCOUNTER — Other Ambulatory Visit: Payer: Self-pay | Admitting: General Surgery

## 2023-11-01 DIAGNOSIS — Z1231 Encounter for screening mammogram for malignant neoplasm of breast: Secondary | ICD-10-CM

## 2023-11-25 ENCOUNTER — Other Ambulatory Visit: Payer: Self-pay | Admitting: Internal Medicine

## 2023-11-25 DIAGNOSIS — R1032 Left lower quadrant pain: Secondary | ICD-10-CM

## 2023-12-05 ENCOUNTER — Ambulatory Visit
Admission: RE | Admit: 2023-12-05 | Discharge: 2023-12-05 | Disposition: A | Discharge status: Home / Self Care | Payer: Medicare Other | Source: Ambulatory Visit | Attending: Internal Medicine | Admitting: Internal Medicine

## 2023-12-05 ENCOUNTER — Other Ambulatory Visit: Payer: Medicare Other

## 2023-12-05 DIAGNOSIS — R1032 Left lower quadrant pain: Secondary | ICD-10-CM

## 2023-12-05 MED ORDER — IOPAMIDOL (ISOVUE-300) INJECTION 61%
100.0000 mL | Freq: Once | INTRAVENOUS | Status: AC | PRN
  Administered 2023-12-05: 100 mL via INTRAVENOUS

## 2024-01-01 ENCOUNTER — Other Ambulatory Visit: Payer: Self-pay | Admitting: General Surgery

## 2024-01-01 DIAGNOSIS — K5732 Diverticulitis of large intestine without perforation or abscess without bleeding: Secondary | ICD-10-CM

## 2024-01-01 DIAGNOSIS — R1032 Left lower quadrant pain: Secondary | ICD-10-CM

## 2024-01-10 ENCOUNTER — Ambulatory Visit
Admission: RE | Admit: 2024-01-10 | Discharge: 2024-01-10 | Disposition: A | Discharge status: Home / Self Care | Payer: Medicare Other | Source: Ambulatory Visit | Attending: General Surgery | Admitting: General Surgery

## 2024-01-10 DIAGNOSIS — Z1231 Encounter for screening mammogram for malignant neoplasm of breast: Secondary | ICD-10-CM | POA: Insufficient documentation

## 2024-01-16 ENCOUNTER — Ambulatory Visit
Admission: RE | Admit: 2024-01-16 | Discharge: 2024-01-16 | Disposition: A | Discharge status: Home / Self Care | Payer: Medicare Other | Source: Ambulatory Visit | Attending: General Surgery | Admitting: General Surgery

## 2024-01-16 DIAGNOSIS — R1032 Left lower quadrant pain: Secondary | ICD-10-CM | POA: Insufficient documentation

## 2024-01-16 DIAGNOSIS — K5732 Diverticulitis of large intestine without perforation or abscess without bleeding: Secondary | ICD-10-CM | POA: Diagnosis present

## 2024-01-16 MED ORDER — IOHEXOL 300 MG/ML  SOLN
100.0000 mL | Freq: Once | INTRAMUSCULAR | Status: AC | PRN
  Administered 2024-01-16: 100 mL via INTRAVENOUS

## 2024-03-31 ENCOUNTER — Ambulatory Visit: Admitting: Dietician

## 2024-03-31 ENCOUNTER — Encounter: Attending: General Surgery | Admitting: Dietician

## 2024-03-31 DIAGNOSIS — N1832 Chronic kidney disease, stage 3b: Secondary | ICD-10-CM | POA: Insufficient documentation

## 2024-03-31 DIAGNOSIS — K5732 Diverticulitis of large intestine without perforation or abscess without bleeding: Secondary | ICD-10-CM | POA: Insufficient documentation

## 2024-03-31 DIAGNOSIS — Z713 Dietary counseling and surveillance: Secondary | ICD-10-CM | POA: Diagnosis not present

## 2024-03-31 DIAGNOSIS — N1831 Chronic kidney disease, stage 3a: Secondary | ICD-10-CM

## 2024-03-31 NOTE — Progress Notes (Unsigned)
 Medical Nutrition Therapy  Appointment Start time:  1400  Appointment End time:  1515  Primary concerns today: Diverticulitis  Referral diagnosis: K57.32 - Diverticulitis of large intestine without perforation or abscess without bleeding, N18.31 - Stage 3a Chronic Kidney Disease Preferred learning style: Auditory, Visual Learning readiness: Change in progress   NUTRITION ASSESSMENT    Clinical Medical Hx: Breast cancer, Bell's Palsy, HLD, CKD S3, Diverticulitis, IBS Medications: Meloxicam , Pregablin, Sennosides, colace (PRN) Labs: eGFR - 54 Notable Signs/Symptoms: N/A  Lifestyle & Dietary Hx Pt reports history of diverticulitis, currently following a soft diet and is now trying to limit fried foods. Pt states they have not had a flare up since around Christmas of 2024, which is when they started their current diet. Pt reports husband suffered fall/neck fracture, and they are having to take on a large amount of responsibilities while he recovers. Pt reports this can be stressful,  But does keep them very active. Pt reports drinking too [place] water, likes coffee and tea, states they will maybe drink 16 oz of water daily.   Estimated daily fluid intake: <48 oz Supplements: Probiotics Sleep: Sleeping later Stress / self-care: Moderate, husband's current health concerns Current average weekly physical activity: ALDs, more active recently  24-Hr Dietary Recall First Meal: Oatmeal w/ raisin Snack:  Second Meal: Grilled cheese Snack:  Third Meal: Leftover lasagna Snack:  Beverages: Coffee, water    NUTRITION DIAGNOSIS  [place]-1.4 Altered GI function As related to diverticulitis.  As evidenced by inconsistent bowel regularity/consistency, painful flare ups when consuming trigger foods (popcorn, berries with seeds).   NUTRITION INTERVENTION  Nutrition education (E-1) on the following topics:  Educated pt on the physiology of the lower GI digestion, and the development of  diverticulitis. Educated patient on food triggers for diverticulitis. Educated patient on appropriate water and fiber intake and the importance of the two together for bowel regularity. Educated pt on the importance of adequate hydration for renal function.  Handouts Provided Include  IBD Nutrtioin Therapy  Insoluble/Soluble Fiber Food list  Learning Style & Readiness for Change Teaching method utilized: Visual & Auditory  Demonstrated degree of understanding via: Teach Back  Barriers to learning/adherence to lifestyle change: None identified  Goals Established by Pt Work towards increasing your water intake to 64 oz. Or 2 L per day. Try to limit your coffee consumption to 1 cup per morning. Continue to choose soft cooked foods as often as possible and minimize raw vegetables. Consider adding a soluble fiber supplement like Metamucil or Benefiber. Take 1 serving at the same time each day. Continue to avoid irritant foods like strawberries, blackberries, raspberries, nuts/seeds, and popcorn. Try adding in a serving of greek yogurt daily for a probiotic. Try OIKOS Triple Zero for a high fiber option!!   MONITORING & EVALUATION Dietary intake, weekly physical activity, and GI symptoms PRN.  Next Steps  Patient is to follow up PRN.

## 2024-03-31 NOTE — Patient Instructions (Addendum)
 Work towards increasing your water intake to 64 oz. Or 2 L per day. Try to limit your coffee consumption to 1 cup per morning.  Continue to choose soft cooked foods as often as possible and minimize raw vegetables.  Consider adding a soluble fiber supplement like Metamucil or Benefiber. Take 1 serving at the same time each day.  Continue to avoid irritant foods like strawberries, blackberries, raspberries, nuts/seeds, and popcorn.  Try adding in a serving of greek yogurt daily for a probiotic. Try OIKOS Triple Zero for a high fiber option!!

## 2024-04-01 ENCOUNTER — Encounter: Payer: Self-pay | Admitting: Dietician

## 2024-10-28 ENCOUNTER — Telehealth: Payer: Self-pay | Admitting: Oncology

## 2024-10-28 NOTE — Telephone Encounter (Signed)
 Pt called to call someone back - looked at notes on acct and pt had received call from someone at [facility] - gave pt number [phone] and transferred call - [facility]

## 2024-11-02 ENCOUNTER — Other Ambulatory Visit: Payer: Self-pay | Admitting: General Surgery

## 2024-11-02 DIAGNOSIS — Z1231 Encounter for screening mammogram for malignant neoplasm of breast: Secondary | ICD-10-CM

## 2025-01-11 ENCOUNTER — Ambulatory Visit
Admission: RE | Admit: 2025-01-11 | Discharge: 2025-01-11 | Disposition: A | Source: Ambulatory Visit | Attending: General Surgery

## 2025-01-11 DIAGNOSIS — Z1231 Encounter for screening mammogram for malignant neoplasm of breast: Secondary | ICD-10-CM
